# Patient Record
Sex: Female | Born: 1975 | Race: Black or African American | Hispanic: No | Marital: Single | Smoking: Never smoker
Health system: Southern US, Community
[De-identification: ages and names within clinical notes are randomized; demographics above are authoritative.]

## PROBLEM LIST (undated history)

## (undated) DIAGNOSIS — F32A Depression, unspecified: Secondary | ICD-10-CM

## (undated) DIAGNOSIS — F419 Anxiety disorder, unspecified: Secondary | ICD-10-CM

## (undated) DIAGNOSIS — F200 Paranoid schizophrenia: Secondary | ICD-10-CM

## (undated) DIAGNOSIS — F4310 Post-traumatic stress disorder, unspecified: Secondary | ICD-10-CM

## (undated) DIAGNOSIS — F329 Major depressive disorder, single episode, unspecified: Secondary | ICD-10-CM

## (undated) DIAGNOSIS — F319 Bipolar disorder, unspecified: Secondary | ICD-10-CM

## (undated) HISTORY — DX: Depression, unspecified: F32.A

## (undated) HISTORY — DX: Major depressive disorder, single episode, unspecified: F32.9

---

## 2016-03-15 ENCOUNTER — Encounter (HOSPITAL_COMMUNITY): Payer: Self-pay | Admitting: *Deleted

## 2016-03-15 ENCOUNTER — Emergency Department (HOSPITAL_COMMUNITY)
Admission: EM | Admit: 2016-03-15 | Discharge: 2016-03-15 | Disposition: A | Discharge status: Home / Self Care | Payer: Medicaid Other | Attending: Emergency Medicine | Admitting: Emergency Medicine

## 2016-03-15 DIAGNOSIS — G8929 Other chronic pain: Secondary | ICD-10-CM | POA: Diagnosis not present

## 2016-03-15 DIAGNOSIS — Z046 Encounter for general psychiatric examination, requested by authority: Secondary | ICD-10-CM | POA: Diagnosis not present

## 2016-03-15 DIAGNOSIS — Z760 Encounter for issue of repeat prescription: Secondary | ICD-10-CM | POA: Diagnosis not present

## 2016-03-15 DIAGNOSIS — F99 Mental disorder, not otherwise specified: Secondary | ICD-10-CM | POA: Diagnosis not present

## 2016-03-15 HISTORY — DX: Paranoid schizophrenia: F20.0

## 2016-03-15 HISTORY — DX: Anxiety disorder, unspecified: F41.9

## 2016-03-15 HISTORY — DX: Bipolar disorder, unspecified: F31.9

## 2016-03-15 HISTORY — DX: Post-traumatic stress disorder, unspecified: F43.10

## 2016-03-15 LAB — COMPREHENSIVE METABOLIC PANEL
ALBUMIN: 3.8 g/dL (ref 3.5–5.0)
ALK PHOS: 76 U/L (ref 38–126)
ALT: 19 U/L (ref 14–54)
ANION GAP: 5 (ref 5–15)
AST: 21 U/L (ref 15–41)
BILIRUBIN TOTAL: 0.4 mg/dL (ref 0.3–1.2)
BUN: 9 mg/dL (ref 6–20)
CALCIUM: 9.4 mg/dL (ref 8.9–10.3)
CO2: 25 mmol/L (ref 22–32)
CREATININE: 0.91 mg/dL (ref 0.44–1.00)
Chloride: 106 mmol/L (ref 101–111)
GFR calc Af Amer: >60 mL/min (ref >60)
GFR calc non Af Amer: >60 mL/min (ref >60)
GLUCOSE: 108 mg/dL — AB (ref 65–99)
Potassium: 3.7 mmol/L (ref 3.5–5.1)
Sodium: 136 mmol/L (ref 135–145)
TOTAL PROTEIN: 7.7 g/dL (ref 6.5–8.1)

## 2016-03-15 LAB — CBC WITH DIFFERENTIAL/PLATELET
BASOS PCT: 0 %
Basophils Absolute: 0 10*3/uL (ref 0.0–0.1)
EOS ABS: 0.1 10*3/uL (ref 0.0–0.7)
EOS PCT: 1 %
HCT: 37.5 % (ref 36.0–46.0)
Hemoglobin: 12.2 g/dL (ref 12.0–15.0)
LYMPHS ABS: 2.1 10*3/uL (ref 0.7–4.0)
Lymphocytes Relative: 28 %
MCH: 30 pg (ref 26.0–34.0)
MCHC: 32.5 g/dL (ref 30.0–36.0)
MCV: 92.4 fL (ref 78.0–100.0)
MONO ABS: 0.5 10*3/uL (ref 0.1–1.0)
MONOS PCT: 7 %
Neutro Abs: 4.7 10*3/uL (ref 1.7–7.7)
Neutrophils Relative %: 64 %
Platelets: 271 10*3/uL (ref 150–400)
RBC: 4.06 MIL/uL (ref 3.87–5.11)
RDW: 14.4 % (ref 11.5–15.5)
WBC: 7.5 10*3/uL (ref 4.0–10.5)

## 2016-03-15 LAB — ETHANOL: Alcohol, Ethyl (B): <5 mg/dL (ref <5)

## 2016-03-15 MED ORDER — ONDANSETRON 4 MG PO TBDP
4.0000 mg | ORAL_TABLET | Freq: Once | ORAL | Status: AC
  Administered 2016-03-15: 4 mg via ORAL
  Filled 2016-03-15: qty 1

## 2016-03-15 MED ORDER — OLANZAPINE 5 MG PO TABS
5.0000 mg | ORAL_TABLET | Freq: Every day | ORAL | Status: DC
  Administered 2016-03-15: 5 mg via ORAL
  Filled 2016-03-15: qty 1

## 2016-03-15 MED ORDER — PROPRANOLOL HCL 10 MG PO TABS
10.0000 mg | ORAL_TABLET | Freq: Two times a day (BID) | ORAL | Status: DC
  Filled 2016-03-15: qty 1

## 2016-03-15 MED ORDER — FLUOXETINE HCL 10 MG PO CAPS: 10.0000 mg | ORAL_CAPSULE | Freq: Every day | ORAL | 0 refills | Status: DC

## 2016-03-15 MED ORDER — OLANZAPINE 5 MG PO TABS: 5.0000 mg | ORAL_TABLET | Freq: Every day | ORAL | 0 refills | Status: DC

## 2016-03-15 MED ORDER — LAMOTRIGINE 25 MG PO TABS: 25.0000 mg | ORAL_TABLET | Freq: Every day | ORAL | 0 refills | Status: DC

## 2016-03-15 MED ORDER — FLUOXETINE HCL 10 MG PO CAPS
10.0000 mg | ORAL_CAPSULE | Freq: Every day | ORAL | Status: DC
  Administered 2016-03-15: 10 mg via ORAL
  Filled 2016-03-15: qty 1

## 2016-03-15 MED ORDER — PROPRANOLOL HCL 10 MG PO TABS
10.0000 mg | ORAL_TABLET | Freq: Two times a day (BID) | ORAL | 0 refills | Status: DC
Start: 2016-03-15 — End: 2017-07-27

## 2016-03-15 MED ORDER — LAMOTRIGINE 25 MG PO TABS
25.0000 mg | ORAL_TABLET | Freq: Every day | ORAL | Status: DC
  Administered 2016-03-15: 25 mg via ORAL
  Filled 2016-03-15: qty 1

## 2016-03-15 NOTE — ED Triage Notes (Signed)
Pt reports being off all her BH meds x 1 month. Pt has extreme anxiety, requesting help with meds and her anxiety but denies any SI or HI. Pt is anxious and tearful at triage.

## 2016-03-15 NOTE — ED Notes (Signed)
TTS at the bedside speaking with patient 

## 2016-03-15 NOTE — BH Assessment (Signed)
Tele Assessment Note   Justice RocherRasheeda S Dungee is a 40 y.o. female who [facility] under the advisement of social security administration. Pt reports moving to [place] from [place] @ a month ago, due to her caregiver not being able to house her anymore. Pt indicates that she has a sister here in [place], but she's not able to stay with her sister, b/c "she's in a program". Pt shares that she was just approved for her Medicaid today. Pt was not able to get any of her medications due to not having insurance prior to today. Pt denies SI, HI, AVH. Pt shares that back in [place] she had a rehabilitation worker to come see her 3xs/month, a therapist to see her 3xs/week, and a psychiatrist to see her 2xs/month. Pt appears to be slightly cognitively limited and could possibly benefit from an ACTT team to help her navigate thru the psychiatric system to restart her services.  Diagnosis: GAD  Past Medical History:  Past Medical History:  Diagnosis Date  . Anxiety   . Bipolar 1 disorder (HCC)   . Paranoid schizophrenia (HCC)   . PTSD (post-traumatic stress disorder)     History reviewed. No pertinent surgical history.  Family History: History reviewed. No pertinent family history.  Social History:  reports that she has never smoked. She does not have any smokeless tobacco history on file. She reports that she does not drink alcohol or use drugs.  Additional Social History:  Alcohol / Drug Use Pain Medications: none noted Prescriptions: none taken in @ a month Over the Counter: none noted History of alcohol / drug use?: No history of alcohol / drug abuse  CIWA: CIWA-Ar BP: 106/69 Pulse Rate: 93 COWS:    PATIENT STRENGTHS: (choose at least two) Capable of independent living Motivation for treatment/growth  Allergies: No Known Allergies  Home Medications:  (Not in a hospital admission)  OB/GYN Status:  Patient's last menstrual period was 03/08/2016.  General  Assessment Data [facility] TTS Assessment: In system Is this a Tele or Face-to-Face Assessment?: Tele Assessment Is this an Initial Assessment or a Re-assessment for this encounter?: Initial Assessment Marital status: Single Is patient pregnant?: No Pregnancy Status: No Living Arrangements: Other (Comment) (reports living w/ sister at times, sister's friend, & hotel) Can pt return to current living arrangement?: Yes Admission Status: Voluntary Is patient capable of signing voluntary admission?: Yes Referral Source: Self/Family/Friend Insurance type: Medicaid     Crisis Care Plan Living Arrangements: Other (Comment) (reports living w/ sister at times, sister's friend, & hotel) Name of Psychiatrist: none Name of Therapist: none  Education Status Is patient currently in school?: No  Risk to self with the past 6 months Suicidal Ideation: No Has patient been a risk to self within the past 6 months prior to admission? : No Suicidal Intent: No Has patient had any suicidal intent within the past 6 months prior to admission? : No Is patient at risk for suicide?: No Suicidal Plan?: No Has patient had any suicidal plan within the past 6 months prior to admission? : No Access to Means: No What has been your use of drugs/alcohol within the last 12 months?: no use Previous Attempts/Gestures: Yes How many times?: 3 Triggers for Past Attempts: Unknown Intentional Self Injurious Behavior: Cutting Comment - Self Injurious Behavior: hx of cutting, as a child Family Suicide History: Unknown Recent stressful life event(s): Other (Comment) (move from [place] w/ no support system) Persecutory voices/beliefs?: No Depression: Yes  Depression Symptoms: Tearfulness Substance abuse history and/or treatment for substance abuse?: No Suicide prevention information given to non-admitted patients: Not applicable  Risk to Others within the past 6 months Homicidal Ideation: No Does  patient have any lifetime risk of violence toward others beyond the six months prior to admission? : No Thoughts of Harm to Others: No Current Homicidal Intent: No Current Homicidal Plan: No Access to Homicidal Means: No History of harm to others?: No Assessment of Violence: None Noted Does patient have access to weapons?: No Criminal Charges Pending?: No Does patient have a court date: No Is patient on probation?: No  Psychosis Hallucinations: None noted Delusions: None noted  Mental Status Report Appearance/Hygiene: Unremarkable Eye Contact: Good Motor Activity: Unremarkable Speech: Logical/coherent Level of Consciousness: Alert Mood: Fearful, Helpless, Sad Affect: Appropriate to circumstance Anxiety Level: Moderate Thought Processes: Coherent, Relevant Judgement: Unimpaired Orientation: Person, Place, Time, Situation Obsessive Compulsive Thoughts/Behaviors: None  Cognitive Functioning Concentration: Normal Memory: Recent Intact, Remote Intact IQ: Average Insight: see judgement above Impulse Control: Good Appetite: Good Sleep: No Change Vegetative Symptoms: None  ADLScreening [facility]) Patient's cognitive ability adequate to safely complete daily activities?: Yes Patient able to express need for assistance with ADLs?: Yes Independently performs ADLs?: Yes (appropriate for developmental age)  Prior Inpatient Therapy Prior Inpatient Therapy: Yes Prior Therapy Dates: over 10 years ago Prior Therapy Facilty/Provider(s): facility in [place] Reason for Treatment: suicide attempt  Prior Outpatient Therapy Prior Outpatient Therapy: Yes Prior Therapy Dates: up until a month ago Prior Therapy Facilty/Provider(s): providers in [place] Does patient have an ACCT team?: No Does patient have Intensive In-House Services?  : No Does patient have [facility] services? : No Does patient have P4CC services?: No  ADL Screening (condition at time of  admission) Patient's cognitive ability adequate to safely complete daily activities?: Yes Is the patient deaf or have difficulty hearing?: No Does the patient have difficulty seeing, even when wearing glasses/contacts?: No Does the patient have difficulty concentrating, remembering, or making decisions?: Yes Patient able to express need for assistance with ADLs?: Yes Does the patient have difficulty dressing or bathing?: No Independently performs ADLs?: Yes (appropriate for developmental age) Does the patient have difficulty walking or climbing stairs?: No Weakness of Legs: None Weakness of Arms/Hands: None  Home Assistive Devices/Equipment Home Assistive Devices/Equipment: None  Therapy Consults (therapy consults require a physician order) PT Evaluation Needed: No OT Evalulation Needed: No SLP Evaluation Needed: No Abuse/Neglect Assessment (Assessment to be complete while patient is alone) Physical Abuse: Denies Verbal Abuse: Denies Sexual Abuse: Denies Exploitation of patient/patient's resources: Denies Values / Beliefs Cultural Requests During Hospitalization: None Spiritual Requests During Hospitalization: None Consults Spiritual Care Consult Needed: No Social Work Consult Needed: No Merchant navy officerAdvance Directives (For Healthcare) Does patient have an advance directive?: No Would patient like information on creating an advanced directive?: No - patient declined information    Additional Information 1:1 In Past 12 Months?: No CIRT Risk: No Elopement Risk: No Does patient have medical clearance?: Yes     Disposition:  Disposition Initial Assessment Completed for this Encounter: Yes (consulted with Fransisca KaufmannLaura Davis, NP) Disposition of Patient: Other dispositions (information given for OP services, including ACTT teams)  Laddie AquasSamantha M Adalida Garver 03/15/2016 6:48 PM

## 2016-03-15 NOTE — Discharge Instructions (Signed)
[facility] to [facility] soon as possible to get established with a psych team here. Meds for 1 month given by us.

## 2016-03-15 NOTE — ED Notes (Signed)
Pt finished with Telepsych

## 2016-03-15 NOTE — ED Provider Notes (Addendum)
[facility] Provider Note   CSN: [id number] Arrival date & time: 03/15/16  1403     History   Chief Complaint Chief Complaint  Patient presents with  . Medical Clearance  . Medication Refill    HPI Judy Melton is a 40 y.o. female.  HPI Pt comes in with cc of mental illness. Pt has hx of bipolar disorder and schizophrenia. [place] [facility] [place] and has been out of her meds. Today she was at social services office and had a mental breakdown and asked to come here. No SI, HI. Pt wants to get better, but feels that she needs help as she has decompensated and doesn't feel normal. Sister at bedside agrees with those sentiments. No drug use.   Past Medical History:  Diagnosis Date  . Anxiety   . Bipolar 1 disorder (HCC)   . Paranoid schizophrenia (HCC)   . PTSD (post-traumatic stress disorder)     There are no active problems to display for this patient.   History reviewed. No pertinent surgical history.  OB History    No data available       Home Medications    Prior to Admission medications   Medication Sig Start Date End Date Taking? Authorizing Provider  Menthol, Topical Analgesic, (BIOFREEZE EX) Apply 1 application topically daily.   Yes Historical Provider, MD  FLUoxetine (PROZAC) 10 MG capsule Take 1 capsule (10 mg total) by mouth daily. 03/15/16   Derwood KaplanAnkit Caoimhe Damron, MD  lamoTRIgine (LAMICTAL) 25 MG tablet Take 1 tablet (25 mg total) by mouth daily. 03/15/16   Derwood KaplanAnkit Ireoluwa Gorsline, MD  OLANZapine (ZYPREXA) 5 MG tablet Take 1 tablet (5 mg total) by mouth daily. 03/15/16   Derwood KaplanAnkit Littleton Haub, MD  propranolol (INDERAL) 10 MG tablet Take 1 tablet (10 mg total) by mouth 2 (two) times daily. 03/15/16   Derwood KaplanAnkit Liane Tribbey, MD    Family History History reviewed. No pertinent family history.  Social History Social History  Substance Use Topics  . Smoking status: Never Smoker  . Smokeless tobacco: Not on file  . Alcohol use No     Allergies   Review of patient's  allergies indicates no known allergies.   Review of Systems Review of Systems  ROS 10 Systems reviewed and are negative for acute change except as noted in the HPI.     Physical Exam Updated Vital Signs BP 106/69   Pulse 93   Temp 98.1 F (36.7 C) (Oral)   Resp 16   Ht 5\' 2"  (1.575 m)   Wt 175 lb (79.4 kg)   LMP 03/08/2016   SpO2 100%   BMI 32.01 kg/m   Physical Exam  Constitutional: She is oriented to person, place, and time. She appears well-developed.  HENT:  Head: Normocephalic and atraumatic.  Eyes: EOM are normal.  Neck: Normal range of motion. Neck supple.  Cardiovascular: Normal rate.   Pulmonary/Chest: Effort normal.  Abdominal: Bowel sounds are normal.  Neurological: She is alert and oriented to person, place, and time.  Skin: Skin is warm and dry.  Psychiatric:  Labile mood, crying  Nursing note and vitals reviewed.    ED Treatments / Results  Labs (all labs ordered are listed, but only abnormal results are displayed) Labs Reviewed  COMPREHENSIVE METABOLIC PANEL - Abnormal; Notable for the following:       Result Value   Glucose, Bld 108 (*)    All other components within normal limits  ETHANOL  CBC WITH DIFFERENTIAL/PLATELET  EKG  EKG Interpretation None       Radiology No results found.  Procedures Procedures (including critical care time)  Medications Ordered in ED Medications  FLUoxetine (PROZAC) capsule 10 mg (10 mg Oral Given 03/15/16 1741)  lamoTRIgine (LAMICTAL) tablet 25 mg (25 mg Oral Given 03/15/16 1726)  OLANZapine (ZYPREXA) tablet 5 mg (5 mg Oral Given 03/15/16 1726)  propranolol (INDERAL) tablet 10 mg (not administered)     Initial Impression / Assessment and Plan / ED Course  I have reviewed the triage vital signs and the nursing notes.  Pertinent labs & imaging results that were available during my care of the patient were reviewed by me and considered in my medical decision making (see chart for  details).  Clinical Course  Comment By Time  TTS clears the patient for outpatient f/u, and [facility] info has been provided. Pt and sister in agreement with the plan. Med prescription provided. Derwood KaplanAnkit Meldrick Buttery, MD 08/21 1847    Pt appears to be decompensated right now. Not psychotic and has no SI, HI. We will get TTS involved and see what their recs are.  Final Clinical Impressions(s) / ED Diagnoses   Final diagnoses:  Medication refill  Chronic mental illness    New Prescriptions Current Discharge Medication List       Derwood KaplanAnkit Aleric Froelich, MD 03/15/16 1708    Derwood KaplanAnkit Terrel Nesheiwat, MD 03/15/16 [id number]

## 2016-03-15 NOTE — ED Notes (Signed)
TTS in progress 

## 2016-08-02 ENCOUNTER — Ambulatory Visit: Payer: Medicaid Other | Admitting: Internal Medicine

## 2016-08-16 ENCOUNTER — Ambulatory Visit (INDEPENDENT_AMBULATORY_CARE_PROVIDER_SITE_OTHER): Payer: Medicaid Other

## 2016-08-16 ENCOUNTER — Ambulatory Visit (INDEPENDENT_AMBULATORY_CARE_PROVIDER_SITE_OTHER): Payer: Medicaid Other | Admitting: Orthopaedic Surgery

## 2016-08-16 ENCOUNTER — Encounter (INDEPENDENT_AMBULATORY_CARE_PROVIDER_SITE_OTHER): Payer: Self-pay

## 2016-08-16 DIAGNOSIS — M545 Low back pain: Secondary | ICD-10-CM | POA: Diagnosis not present

## 2016-08-16 DIAGNOSIS — G8929 Other chronic pain: Secondary | ICD-10-CM | POA: Diagnosis not present

## 2016-08-16 DIAGNOSIS — M549 Dorsalgia, unspecified: Secondary | ICD-10-CM

## 2016-08-16 MED ORDER — TIZANIDINE HCL 4 MG PO TABS: 4.0000 mg | ORAL_TABLET | Freq: Two times a day (BID) | ORAL | 0 refills | Status: DC | PRN

## 2016-08-16 MED ORDER — MELOXICAM 7.5 MG PO TABS: 7.5000 mg | ORAL_TABLET | Freq: Two times a day (BID) | ORAL | 1 refills | Status: DC | PRN

## 2016-08-16 MED ORDER — METHYLPREDNISOLONE 4 MG PO TABS: ORAL_TABLET | ORAL | 0 refills | Status: DC
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
## 2016-09-11 ENCOUNTER — Other Ambulatory Visit (INDEPENDENT_AMBULATORY_CARE_PROVIDER_SITE_OTHER): Payer: Self-pay | Admitting: Orthopaedic Surgery

## 2016-09-13 NOTE — Telephone Encounter (Signed)
Please advise 

## 2016-09-27 ENCOUNTER — Ambulatory Visit (INDEPENDENT_AMBULATORY_CARE_PROVIDER_SITE_OTHER): Payer: Medicaid Other | Admitting: Orthopaedic Surgery

## 2016-09-28 ENCOUNTER — Ambulatory Visit (INDEPENDENT_AMBULATORY_CARE_PROVIDER_SITE_OTHER): Payer: Medicaid Other | Admitting: Orthopaedic Surgery

## 2016-10-09 ENCOUNTER — Other Ambulatory Visit (INDEPENDENT_AMBULATORY_CARE_PROVIDER_SITE_OTHER): Payer: Self-pay | Admitting: Orthopaedic Surgery

## 2016-10-11 ENCOUNTER — Ambulatory Visit (INDEPENDENT_AMBULATORY_CARE_PROVIDER_SITE_OTHER): Payer: Medicaid Other | Admitting: Orthopaedic Surgery

## 2016-10-11 DIAGNOSIS — M546 Pain in thoracic spine: Secondary | ICD-10-CM | POA: Diagnosis not present

## 2016-10-11 NOTE — Telephone Encounter (Signed)
Please advise 

## 2016-10-11 NOTE — Progress Notes (Signed)
The patient is feeling much better after having a combination of a steroid taper, meloxicam, and a muscle relaxant. She is also been to therapy mainly with a personal trainer working on core strengthening exercises and other exercises. She said her pain now this point is minimal she only says is in the mid back. She denies any radicular symptoms at all. She denies any severe pain. She said the meloxicam his work for her the best.  On examination she has full flexion-extension of her cervical thoracic and lumbar spine without difficulty. She has 5 out of 5 strength of her bilateral upper and lower extremities. She has some midline tenderness of the thoracic spine but is minimal  This point she is on meloxicam as work for her she can continue this as needed. She'll continue her regular working with her Systems analystpersonal trainer. She can always try intermittent ice and heat. She'll follow-up as needed at this point since she is done so well.

## 2016-10-13 ENCOUNTER — Ambulatory Visit: Payer: Medicaid Other | Attending: Family Medicine | Admitting: Family Medicine

## 2016-10-13 ENCOUNTER — Encounter: Payer: Self-pay | Admitting: Family Medicine

## 2016-10-13 VITALS — BP 115/67 | HR 77 | Temp 98.3°F | Ht 64.0 in | Wt 194.0 lb

## 2016-10-13 DIAGNOSIS — F319 Bipolar disorder, unspecified: Secondary | ICD-10-CM | POA: Diagnosis not present

## 2016-10-13 DIAGNOSIS — K2100 Gastro-esophageal reflux disease with esophagitis, without bleeding: Secondary | ICD-10-CM

## 2016-10-13 DIAGNOSIS — Z0001 Encounter for general adult medical examination with abnormal findings: Secondary | ICD-10-CM | POA: Insufficient documentation

## 2016-10-13 DIAGNOSIS — M545 Low back pain, unspecified: Secondary | ICD-10-CM

## 2016-10-13 DIAGNOSIS — H527 Unspecified disorder of refraction: Secondary | ICD-10-CM

## 2016-10-13 DIAGNOSIS — Z853 Personal history of malignant neoplasm of breast: Secondary | ICD-10-CM | POA: Insufficient documentation

## 2016-10-13 DIAGNOSIS — M549 Dorsalgia, unspecified: Secondary | ICD-10-CM

## 2016-10-13 DIAGNOSIS — F200 Paranoid schizophrenia: Secondary | ICD-10-CM | POA: Diagnosis not present

## 2016-10-13 DIAGNOSIS — K219 Gastro-esophageal reflux disease without esophagitis: Secondary | ICD-10-CM | POA: Insufficient documentation

## 2016-10-13 DIAGNOSIS — Z9221 Personal history of antineoplastic chemotherapy: Secondary | ICD-10-CM | POA: Insufficient documentation

## 2016-10-13 DIAGNOSIS — G8929 Other chronic pain: Secondary | ICD-10-CM | POA: Diagnosis not present

## 2016-10-13 DIAGNOSIS — Z9889 Other specified postprocedural states: Secondary | ICD-10-CM | POA: Insufficient documentation

## 2016-10-13 DIAGNOSIS — Z79899 Other long term (current) drug therapy: Secondary | ICD-10-CM | POA: Diagnosis not present

## 2016-10-13 DIAGNOSIS — R112 Nausea with vomiting, unspecified: Secondary | ICD-10-CM | POA: Diagnosis not present

## 2016-10-13 MED ORDER — PROMETHAZINE HCL 25 MG PO TABS: 25.0000 mg | ORAL_TABLET | Freq: Two times a day (BID) | ORAL | 1 refills | Status: DC | PRN

## 2016-10-13 MED ORDER — MELOXICAM 7.5 MG PO TABS: 7.5000 mg | ORAL_TABLET | Freq: Every day | ORAL | 3 refills | Status: DC

## 2016-10-13 MED ORDER — OMEPRAZOLE 20 MG PO CPDR: 20.0000 mg | DELAYED_RELEASE_CAPSULE | Freq: Every day | ORAL | 3 refills | Status: DC

## 2016-10-13 MED ORDER — POLYETHYLENE GLYCOL 3350 17 GM/SCOOP PO POWD: 17.0000 g | Freq: Two times a day (BID) | ORAL | 1 refills | Status: AC | PRN

## 2016-10-13 NOTE — Progress Notes (Signed)
Subjective:  Patient ID: Judy Melton, female    DOB: 01/18/1976  Age: 41 y.o. MRN: [id number]  CC: hx of breast cancer; eye MD referral; Arthritis; Manic Behavior; Schizophrenia; Anxiety; Constipation; and Gastroesophageal Reflux   HPI Judy Melton is a 41 year old female with a history of schizophrenia, bipolar disorder, left breast cancer (diagnosed in 2010 status post lumpectomy and chemotherapy), chronic low back pain, GERD who presents today to establish care after relocating from [place] in 01/2016.  Her schizophrenia and bipolar disorder are managed by mental health where she receives her medications and undergoes therapy.  She takes meloxicam and tizanidine as needed for chronic low back pain which does not radiate to her legs; informs me she was told she had arthritis in her back. Denies numbness in extremities or and has no loss of sphincteric function.  Complains nausea and vomiting with anything she ingests, associated bloating and occasional epigastric pain. Denies symptoms at this time. States symptoms are embarrassing and unpredictable; sometimes she has to run out of a room to go throw up when symptoms occur and at other times she is able to hold it in. Requests referral to an ophthalmologist due to vision problems.  Past Medical History:  Diagnosis Date  . Anxiety   . Bipolar 1 disorder (HCC)   . Paranoid schizophrenia (HCC)   . PTSD (post-traumatic stress disorder)     History reviewed. No pertinent surgical history.  No Known Allergies   Outpatient Medications Prior to Visit  Medication Sig Dispense Refill  . FLUoxetine (PROZAC) 10 MG capsule Take 1 capsule (10 mg total) by mouth daily. 30 capsule 0  . lamoTRIgine (LAMICTAL) 25 MG tablet Take 1 tablet (25 mg total) by mouth daily. 30 tablet 0  . Menthol, Topical Analgesic, (BIOFREEZE EX) Apply 1 application topically daily.    Marland Kitchen. OLANZapine (ZYPREXA) 5 MG tablet Take 1 tablet (5 mg total) by mouth  daily. 30 tablet 0  . propranolol (INDERAL) 10 MG tablet Take 1 tablet (10 mg total) by mouth 2 (two) times daily. 60 tablet 0  . tiZANidine (ZANAFLEX) 4 MG tablet TAKE 1 TABLET (4 MG TOTAL) BY MOUTH 2 (TWO) TIMES DAILY AS NEEDED FOR MUSCLE SPASMS. 60 tablet 0  . meloxicam (MOBIC) 7.5 MG tablet TAKE 1 TABLET (7.5 MG TOTAL) BY MOUTH 2 (TWO) TIMES DAILY BETWEEN MEALS AS NEEDED FOR PAIN. 60 tablet 1  . methylPREDNISolone (MEDROL) 4 MG tablet Medrol dose pack. Take as instructed 21 tablet 0   No facility-administered medications prior to visit.     ROS Review of Systems  Constitutional: Negative for activity change, appetite change and fatigue.  HENT: Negative for congestion, sinus pressure and sore throat.   Eyes: Positive for visual disturbance.  Respiratory: Negative for cough, chest tightness, shortness of breath and wheezing.   Cardiovascular: Negative for chest pain and palpitations.  Gastrointestinal: Positive for constipation, nausea and vomiting. Negative for abdominal distention and abdominal pain.  Endocrine: Negative for polydipsia.  Genitourinary: Negative for dysuria and frequency.  Musculoskeletal: Positive for back pain. Negative for arthralgias.  Skin: Negative for rash.  Neurological: Negative for tremors, light-headedness and numbness.  Hematological: Does not bruise/bleed easily.  Psychiatric/Behavioral: Negative for agitation and behavioral problems.    Objective:  BP 115/67 (BP Location: Right Arm, Patient Position: Sitting, Cuff Size: Small)   Pulse 77   Temp 98.3 F (36.8 C) (Oral)   Ht 5\' 4"  (1.626 m)   Wt 194 lb (88 kg)  SpO2 97%   BMI 33.30 kg/m   BP/Weight 10/13/2016 03/15/2016  Systolic BP 115 123  Diastolic BP 67 75  Wt. (Lbs) 194 175  BMI 33.3 32.01      Physical Exam  Constitutional: She is oriented to person, place, and time. She appears well-developed and well-nourished.  Cardiovascular: Normal rate, normal heart sounds and intact distal  pulses.   No murmur heard. Pulmonary/Chest: Effort normal and breath sounds normal. She has no wheezes. She has no rales. She exhibits no tenderness.  Abdominal: Soft. Bowel sounds are normal. She exhibits no distension and no mass. There is no tenderness.  Musculoskeletal: Normal range of motion.  Neurological: She is alert and oriented to person, place, and time.  Skin: Skin is warm and dry.  Psychiatric: She has a normal mood and affect.     Assessment & Plan:   1. Gastroesophageal reflux disease with esophagitis - H. pylori breath test - omeprazole (PRILOSEC) 20 MG capsule; Take 1 capsule (20 mg total) by mouth daily.  Dispense: 30 capsule; Refill: 3 - promethazine (PHENERGAN) 25 MG tablet; Take 1 tablet (25 mg total) by mouth every 12 (twelve) hours as needed for nausea or vomiting.  Dispense: 60 tablet; Refill: 1  2. History of breast cancer At post lumpectomy and chemotherapy Will need to obtain previous records Will need referral to oncology at the next visit  3. Chronic midline low back pain without sciatica Controlled on meloxicam and tizanidine which she uses as needed Apply heat - meloxicam (MOBIC) 7.5 MG tablet; Take 1 tablet (7.5 mg total) by mouth daily.  Dispense: 30 tablet; Refill: 3  4. Refractive errors - Ambulatory referral to Ophthalmology  5. Paranoid schizophrenia (HCC) Currently on Zyprexa, Invega  6. Bipolar 1 disorder (HCC) Continue Prozac Continue appointments with psychiatry and therapist   Meds ordered this encounter  Medications  . meloxicam (MOBIC) 7.5 MG tablet    Sig: Take 1 tablet (7.5 mg total) by mouth daily.    Dispense:  30 tablet    Refill:  3  . omeprazole (PRILOSEC) 20 MG capsule    Sig: Take 1 capsule (20 mg total) by mouth daily.    Dispense:  30 capsule    Refill:  3  . promethazine (PHENERGAN) 25 MG tablet    Sig: Take 1 tablet (25 mg total) by mouth every 12 (twelve) hours as needed for nausea or vomiting.    Dispense:   60 tablet    Refill:  1  . polyethylene glycol powder (GLYCOLAX/MIRALAX) powder    Sig: Take 17 g by mouth 2 (two) times daily as needed.    Dispense:  3350 g    Refill:  1    Follow-up: Return in about 1 month (around 11/13/2016) for Follow-up: GERD.   Jaclyn Shaggy MD

## 2016-10-13 NOTE — Patient Instructions (Signed)
Food Choices for Gastroesophageal Reflux Disease, Adult When you have gastroesophageal reflux disease (GERD), the foods you eat and your eating habits are very important. Choosing the right foods can help ease your discomfort. What guidelines do I need to follow?  Choose fruits, vegetables, whole grains, and low-fat dairy products.  Choose low-fat meat, fish, and poultry.  Limit fats such as oils, salad dressings, butter, nuts, and avocado.  Keep a food diary. This helps you identify foods that cause symptoms.  Avoid foods that cause symptoms. These may be different for everyone.  Eat small meals often instead of 3 large meals a day.  Eat your meals slowly, in a place where you are relaxed.  Limit fried foods.  Cook foods using methods other than frying.  Avoid drinking alcohol.  Avoid drinking large amounts of liquids with your meals.  Avoid bending over or lying down until 2-3 hours after eating. What foods are not recommended? These are some foods and drinks that may make your symptoms worse: Vegetables  Tomatoes. Tomato juice. Tomato and spaghetti sauce. Chili peppers. Onion and garlic. Horseradish. Fruits  Oranges, grapefruit, and lemon (fruit and juice). Meats  High-fat meats, fish, and poultry. This includes hot dogs, ribs, ham, sausage, salami, and bacon. Dairy  Whole milk and chocolate milk. Sour cream. Cream. Butter. Ice cream. Cream cheese. Drinks  Coffee and tea. Bubbly (carbonated) drinks or energy drinks. Condiments  Hot sauce. Barbecue sauce. Sweets/Desserts  Chocolate and cocoa. Donuts. Peppermint and spearmint. Fats and Oils  High-fat foods. This includes French fries and potato chips. Other  Vinegar. Strong spices. This includes black pepper, white pepper, red pepper, cayenne, curry powder, cloves, ginger, and chili powder. The items listed above may not be a complete list of foods and drinks to avoid. Contact your dietitian for more information.    This information is not intended to replace advice given to you by your health care provider. Make sure you discuss any questions you have with your health care provider. Document Released: 01/11/2012 Document Revised: 12/18/2015 Document Reviewed: 05/16/2013 Elsevier Interactive Patient Education  2017 Elsevier Inc.  

## 2016-10-14 LAB — H. PYLORI BREATH TEST: H. pylori Breath Test: NOT DETECTED

## 2016-10-18 ENCOUNTER — Other Ambulatory Visit: Payer: Medicaid Other

## 2016-10-26 ENCOUNTER — Telehealth: Payer: Self-pay

## 2016-10-26 NOTE — Telephone Encounter (Signed)
Writer called patient with lab results. Patient stated understanding. 

## 2016-10-26 NOTE — Telephone Encounter (Signed)
-----   Message from Jaclyn Shaggy, MD sent at 10/14/2016  2:06 PM EDT ----- Please inform the patient that labs are normal. Thank you.

## 2016-10-27 ENCOUNTER — Other Ambulatory Visit: Payer: Medicaid Other

## 2016-11-06 ENCOUNTER — Other Ambulatory Visit (INDEPENDENT_AMBULATORY_CARE_PROVIDER_SITE_OTHER): Payer: Self-pay | Admitting: Orthopaedic Surgery

## 2016-11-08 NOTE — Telephone Encounter (Signed)
Please advise 

## 2016-11-12 ENCOUNTER — Ambulatory Visit: Payer: Medicaid Other | Admitting: Family Medicine

## 2016-12-04 ENCOUNTER — Other Ambulatory Visit (INDEPENDENT_AMBULATORY_CARE_PROVIDER_SITE_OTHER): Payer: Self-pay | Admitting: Orthopaedic Surgery

## 2016-12-06 NOTE — Telephone Encounter (Signed)
Please advise 

## 2017-01-24 ENCOUNTER — Other Ambulatory Visit (INDEPENDENT_AMBULATORY_CARE_PROVIDER_SITE_OTHER): Payer: Self-pay | Admitting: Orthopaedic Surgery

## 2017-01-24 NOTE — Telephone Encounter (Signed)
Please advise 

## 2017-03-07 ENCOUNTER — Other Ambulatory Visit: Payer: Self-pay | Admitting: Family Medicine

## 2017-03-07 ENCOUNTER — Other Ambulatory Visit (INDEPENDENT_AMBULATORY_CARE_PROVIDER_SITE_OTHER): Payer: Self-pay | Admitting: Orthopaedic Surgery

## 2017-03-07 DIAGNOSIS — K2100 Gastro-esophageal reflux disease with esophagitis, without bleeding: Secondary | ICD-10-CM

## 2017-03-07 NOTE — Telephone Encounter (Signed)
Please advise 

## 2017-04-14 ENCOUNTER — Emergency Department (HOSPITAL_COMMUNITY)
Admission: EM | Admit: 2017-04-14 | Discharge: 2017-04-14 | Disposition: A | Discharge status: Home / Self Care | Payer: Medicaid Other | Attending: Emergency Medicine | Admitting: Emergency Medicine

## 2017-04-14 ENCOUNTER — Encounter (HOSPITAL_COMMUNITY): Payer: Self-pay | Admitting: Emergency Medicine

## 2017-04-14 DIAGNOSIS — N912 Amenorrhea, unspecified: Secondary | ICD-10-CM | POA: Diagnosis not present

## 2017-04-14 DIAGNOSIS — M545 Low back pain, unspecified: Secondary | ICD-10-CM

## 2017-04-14 DIAGNOSIS — Z79899 Other long term (current) drug therapy: Secondary | ICD-10-CM | POA: Insufficient documentation

## 2017-04-14 DIAGNOSIS — Z853 Personal history of malignant neoplasm of breast: Secondary | ICD-10-CM | POA: Insufficient documentation

## 2017-04-14 LAB — URINALYSIS, ROUTINE W REFLEX MICROSCOPIC
Bilirubin Urine: NEGATIVE
GLUCOSE, UA: NEGATIVE mg/dL
Hgb urine dipstick: NEGATIVE
Ketones, ur: NEGATIVE mg/dL
Leukocytes, UA: NEGATIVE
Nitrite: NEGATIVE
PH: 6 (ref 5.0–8.0)
Protein, ur: NEGATIVE mg/dL
SPECIFIC GRAVITY, URINE: 1.009 (ref 1.005–1.030)

## 2017-04-14 LAB — POC URINE PREG, ED: Preg Test, Ur: NEGATIVE

## 2017-04-14 NOTE — ED Triage Notes (Addendum)
Pt complaint of lower back pain, urinary frequency, and tender breasts; 12 days late for menstrual cycle. Pt seen at urgent care for pregnancy test; negative result. Pt denies vaginal bleeding, pain, or abnormal discharge/odor.

## 2017-04-14 NOTE — ED Notes (Signed)
Bed: WTR8 Expected date:  Expected time:  Means of arrival:  Comments: 

## 2017-04-14 NOTE — ED Provider Notes (Signed)
[facility]-EMERGENCY DEPT Provider Note   CSN: [id number] Arrival date & time: 04/14/17  1026     History   Chief Complaint Chief Complaint  Patient presents with  . Back Pain    HPI Judy Melton is a 41 y.o. female.  The history is provided by the patient. No language interpreter was used.  Back Pain   This is a new problem. The problem occurs constantly. The problem has not changed since onset.The pain is associated with no known injury. The pain is present in the lumbar spine. The pain is moderate. The pain is worse during the day. She has tried nothing for the symptoms. The treatment provided no relief.  Pt is concerned because she has not had a period since August 11.  Pt reports she had a negative home pregnancy test.    Past Medical History:  Diagnosis Date  . Anxiety   . Bipolar 1 disorder (HCC)   . Paranoid schizophrenia (HCC)   . PTSD (post-traumatic stress disorder)     Patient Active Problem List   Diagnosis Date Noted  . GERD (gastroesophageal reflux disease) 10/13/2016  . History of breast cancer 10/13/2016  . Chronic back pain 10/13/2016  . Paranoid schizophrenia (HCC) 10/13/2016  . Bipolar 1 disorder (HCC) 10/13/2016  . Pain in thoracic spine 10/11/2016    History reviewed. No pertinent surgical history.  OB History    No data available       Home Medications    Prior to Admission medications   Medication Sig Start Date End Date Taking? Authorizing Provider  FLUoxetine (PROZAC) 10 MG capsule Take 1 capsule (10 mg total) by mouth daily. 03/15/16   Derwood Kaplan, MD  lamoTRIgine (LAMICTAL) 25 MG tablet Take 1 tablet (25 mg total) by mouth daily. 03/15/16   Derwood Kaplan, MD  meloxicam (MOBIC) 7.5 MG tablet Take 1 tablet (7.5 mg total) by mouth daily. 10/13/16   Jaclyn Shaggy, MD  meloxicam (MOBIC) 7.5 MG tablet TAKE 1 TABLET (7.5 MG TOTAL) BY MOUTH 2 (TWO) TIMES DAILY BETWEEN MEALS AS NEEDED FOR PAIN. 03/07/17   Kathryne Hitch, MD    Menthol, Topical Analgesic, (BIOFREEZE EX) Apply 1 application topically daily.    [provider]  OLANZapine (ZYPREXA) 5 MG tablet Take 1 tablet (5 mg total) by mouth daily. 03/15/16   Derwood Kaplan, MD  omeprazole (PRILOSEC) 20 MG capsule TAKE 1 CAPSULE (20 MG TOTAL) BY MOUTH DAILY. 03/07/17   Jaclyn Shaggy, MD  paliperidone (INVEGA) 6 MG 24 hr tablet Take 6 mg by mouth daily.    [provider]  polyethylene glycol powder (GLYCOLAX/MIRALAX) powder Take 17 g by mouth 2 (two) times daily as needed. 10/13/16   Jaclyn Shaggy, MD  promethazine (PHENERGAN) 25 MG tablet Take 1 tablet (25 mg total) by mouth every 12 (twelve) hours as needed for nausea or vomiting. 10/13/16   Jaclyn Shaggy, MD  propranolol (INDERAL) 10 MG tablet Take 1 tablet (10 mg total) by mouth 2 (two) times daily. 03/15/16   Derwood Kaplan, MD  tiZANidine (ZANAFLEX) 4 MG tablet TAKE 1 TABLET (4 MG TOTAL) BY MOUTH 2 (TWO) TIMES DAILY AS NEEDED FOR MUSCLE SPASMS. 03/07/17   Kathryne Hitch, MD    Family History No family history on file.  Social History Social History  Substance Use Topics  . Smoking status: Never Smoker  . Smokeless tobacco: Never Used  . Alcohol use No     Allergies   Patient has no known  allergies.   Review of Systems Review of Systems  Musculoskeletal: Positive for back pain.  All other systems reviewed and are negative.    Physical Exam Updated Vital Signs BP 126/76 (BP Location: Left Arm)   Pulse 86   Temp 98.3 F (36.8 C) (Oral)   Resp 17   Ht  (1.575 m)   Wt 89.4 kg (197 lb)   LMP 03/05/2017   SpO2 99%   BMI 36.03 kg/m   Physical Exam  Constitutional: She appears well-developed.  HENT:  Head: Normocephalic.  Right Ear: External ear normal.  Left Ear: External ear normal.  Eyes: Pupils are equal, round, and reactive to light.  Neck: Normal range of motion.  Cardiovascular: Normal rate.   Pulmonary/Chest: Effort normal.  Abdominal: Soft.   Musculoskeletal: Normal range of motion.  Neurological: She is alert.  Skin: Skin is warm. Capillary refill takes less than 2 seconds.  Nursing note and vitals reviewed.    ED Treatments / Results  Labs (all labs ordered are listed, but only abnormal results are displayed) Labs Reviewed  URINALYSIS, ROUTINE W REFLEX MICROSCOPIC  POC URINE PREG, ED    EKG  EKG Interpretation None       Radiology No results found.  Procedures Procedures (including critical care time)  Medications Ordered in ED Medications - No data to display   Initial Impression / Assessment and Plan / ED Course  I have reviewed the triage vital signs and the nursing notes.  Pertinent labs & imaging results that were available during my care of the patient were reviewed by me and considered in my medical decision making (see chart for details).     Pt advised to schedule appointment at [facility] gyn clinic for evaluation.   Tylenol for back pain  Final Clinical Impressions(s) / ED Diagnoses   Final diagnoses:  Low back pain without sciatica, unspecified back pain laterality, unspecified chronicity  Amenorrhea    New Prescriptions Discharge Medication List as of 04/14/2017 12:05 PM    An After Visit Summary was printed and given to the patient.   Elson Areas, Cordelia Poche 04/14/17 1240    Linwood Dibbles, MD 04/15/17 [phone]

## 2017-04-18 ENCOUNTER — Encounter: Payer: Medicaid Other | Admitting: Obstetrics & Gynecology

## 2017-04-27 ENCOUNTER — Ambulatory Visit (INDEPENDENT_AMBULATORY_CARE_PROVIDER_SITE_OTHER): Payer: Medicaid Other | Admitting: Obstetrics & Gynecology

## 2017-04-27 ENCOUNTER — Other Ambulatory Visit (HOSPITAL_COMMUNITY)
Admission: RE | Admit: 2017-04-27 | Discharge: 2017-04-27 | Disposition: A | Discharge status: Home / Self Care | Payer: Medicaid Other | Source: Ambulatory Visit | Attending: Obstetrics & Gynecology | Admitting: Obstetrics & Gynecology

## 2017-04-27 ENCOUNTER — Ambulatory Visit: Payer: Self-pay | Admitting: Clinical

## 2017-04-27 ENCOUNTER — Encounter: Payer: Self-pay | Admitting: Obstetrics & Gynecology

## 2017-04-27 VITALS — BP 102/54 | HR 84 | Wt 196.6 lb

## 2017-04-27 DIAGNOSIS — Z124 Encounter for screening for malignant neoplasm of cervix: Secondary | ICD-10-CM | POA: Diagnosis not present

## 2017-04-27 DIAGNOSIS — B9689 Other specified bacterial agents as the cause of diseases classified elsewhere: Secondary | ICD-10-CM | POA: Insufficient documentation

## 2017-04-27 DIAGNOSIS — F39 Unspecified mood [affective] disorder: Secondary | ICD-10-CM

## 2017-04-27 DIAGNOSIS — N926 Irregular menstruation, unspecified: Secondary | ICD-10-CM | POA: Diagnosis not present

## 2017-04-27 DIAGNOSIS — Z0000 Encounter for general adult medical examination without abnormal findings: Secondary | ICD-10-CM

## 2017-04-27 DIAGNOSIS — Z113 Encounter for screening for infections with a predominantly sexual mode of transmission: Secondary | ICD-10-CM
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
## 2017-04-27 NOTE — BH Specialist Note (Signed)
Integrated Behavioral Health Initial Visit  MRN: [id number] Name: Judy Melton  Number of Integrated Behavioral Health Clinician visits:: 1/6 Session Start time: 3:45  Session End time: 4:00 Total time: 15 minutes  Type of Service: Integrated Behavioral Health- Individual/Family Interpretor:No. Interpretor Name and Language: n/a   Warm Hand Off Completed.       SUBJECTIVE: Judy Melton is a 41 y.o. female accompanied by n/a Patient was referred by Dr Marice Potter for pt's self-report of depression. Patient reports the following symptoms/concerns: Pt states she is feeling "a little depressed" lately, since finding out she won't be able to have more children, and is concerned that she has been forgetting to eat, leading to headaches. Pt says she is going to a therapist weekly, and sees a psychiatrist regularly.  Duration of problem: Ongoing; Severity of problem: severe  OBJECTIVE: Mood: Depressed and Affect: Appropriate Risk of harm to self or others: No plan to harm self or others  LIFE CONTEXT: Family and Social: Lives by herself; has a 24yo son. Pt's extended family is close and supportive. School/Work: - Self-Care: Pt attends therapy weekly for self care; likes using the Calm app sometimes for self-care Life Changes: Recently forgetting to eat, resulting in headaches  GOALS ADDRESSED: Patient will: 1. Reduce symptoms of: depression and headache 2. Increase knowledge and/or ability of: self-management skills  3. Demonstrate ability to: Increase healthy adjustment to current life circumstances  INTERVENTIONS: Interventions utilized: Supportive Counseling and Psychoeducation and/or Health Education  Standardized Assessments completed: GAD-7 and PHQ 9  ASSESSMENT: Patient currently experiencing Mood disorder.   Patient may benefit from psychoeducation and brief therapeutic intervention regarding coping with symptoms of depression related to mood  disorder.  PLAN: 1. Follow up with behavioral health clinician on : As needed 2. Behavioral recommendations:  -Set daily timer on phone, as a reminder to eat regular meals, to help prevent future headaches -Continue attending weekly therapy and psychiatry appointments at [facility] -Read educational material regarding coping with symptoms of depression -Consider additional apps for self-care, as discussed in office visit 3. Referral(s): Integrated [facility] (In Clinic) 4. "From scale of 1-10, how likely are you to follow plan?": 7    Rae Lips, LCSWA  Depression screen [facility] 2/9 10/13/2016  Decreased Interest 3  Down, Depressed, Hopeless 3  PHQ - 2 Score 6  Altered sleeping 3  Tired, decreased energy 3  Change in appetite 3  Feeling bad or failure about yourself  3  Trouble concentrating 2  Moving slowly or fidgety/restless 3  Suicidal thoughts 0  PHQ-9 Score 23   GAD 7 : Generalized Anxiety Score 10/13/2016  Nervous, Anxious, on Edge 3  Control/stop worrying 3  Worry too much - different things 3  Trouble relaxing 3  Restless 3  Easily annoyed or irritable 3  Afraid - awful might happen 3  Total GAD 7 Score 21

## 2017-04-28 LAB — CBC
Hematocrit: 35.6 % (ref 34.0–46.6)
Hemoglobin: 11.5 g/dL (ref 11.1–15.9)
MCH: 29 pg (ref 26.6–33.0)
MCHC: 32.3 g/dL (ref 31.5–35.7)
MCV: 90 fL (ref 79–97)
PLATELETS: 341 10*3/uL (ref 150–379)
RBC: 3.96 x10E6/uL (ref 3.77–5.28)
RDW: 15.2 % (ref 12.3–15.4)
WBC: 5.3 10*3/uL (ref 3.4–10.8)

## 2017-04-28 LAB — RPR: RPR Ser Ql: NONREACTIVE

## 2017-04-28 LAB — HIV ANTIBODY (ROUTINE TESTING W REFLEX): HIV Screen 4th Generation wRfx: NONREACTIVE

## 2017-04-28 LAB — HEPATITIS C ANTIBODY: Hep C Virus Ab: <0.1 s/co ratio (ref 0.0–0.9)

## 2017-04-28 LAB — HEPATITIS B SURFACE ANTIGEN: Hepatitis B Surface Ag: NEGATIVE

## 2017-04-28 LAB — TSH: TSH: 1.91 u[IU]/mL (ref 0.450–4.500)

## 2017-04-29 LAB — CYTOLOGY - PAP
Chlamydia: NEGATIVE
DIAGNOSIS: NEGATIVE
HPV (WINDOPATH): NOT DETECTED
NEISSERIA GONORRHEA: NEGATIVE

## 2017-05-18 ENCOUNTER — Emergency Department (HOSPITAL_COMMUNITY)
Admission: EM | Admit: 2017-05-18 | Discharge: 2017-05-18 | Disposition: A | Discharge status: Home / Self Care | Payer: Medicaid Other | Attending: Emergency Medicine | Admitting: Emergency Medicine

## 2017-05-18 ENCOUNTER — Encounter (HOSPITAL_COMMUNITY): Payer: Self-pay | Admitting: Emergency Medicine

## 2017-05-18 DIAGNOSIS — Z853 Personal history of malignant neoplasm of breast: Secondary | ICD-10-CM | POA: Insufficient documentation

## 2017-05-18 DIAGNOSIS — F419 Anxiety disorder, unspecified: Secondary | ICD-10-CM | POA: Diagnosis not present

## 2017-05-18 DIAGNOSIS — Z9114 Patient's other noncompliance with medication regimen: Secondary | ICD-10-CM | POA: Insufficient documentation

## 2017-05-18 DIAGNOSIS — R51 Headache: Secondary | ICD-10-CM | POA: Diagnosis not present

## 2017-05-18 DIAGNOSIS — Z008 Encounter for other general examination: Secondary | ICD-10-CM | POA: Insufficient documentation

## 2017-05-18 DIAGNOSIS — R410 Disorientation, unspecified: Secondary | ICD-10-CM | POA: Diagnosis present

## 2017-05-18 MED ORDER — HYDROXYZINE HCL 25 MG PO TABS
25.0000 mg | ORAL_TABLET | Freq: Four times a day (QID) | ORAL | Status: DC | PRN
Start: 2017-05-18 — End: 2017-05-18
  Administered 2017-05-18: 25 mg via ORAL
  Filled 2017-05-18: qty 1

## 2017-05-18 MED ORDER — HYDROXYZINE HCL 25 MG PO TABS: 25.0000 mg | ORAL_TABLET | Freq: Four times a day (QID) | ORAL | 0 refills | Status: AC | PRN

## 2017-05-18 NOTE — ED Provider Notes (Signed)
[facility]-EMERGENCY DEPT Provider Note   CSN: [id number] Arrival date & time: 05/18/17  1108     History   Chief Complaint Chief Complaint  Patient presents with  . Anxiety    HPI Judy Melton is a 41 y.o. female.  HPI Patient is a 41 year old female with a history of bipolar disorder, PTSD, schizophrenia, anxiety who presents to the emergency department at the request of her therapist for increasing emotional instability.  Patient reports that she feels confused.  She has been off her medications.  She has been sad and at times angry and upset.  Her moods have been rapidly fluctuating.  She is having headaches.  Patient reports increasing stressors at work.   Past Medical History:  Diagnosis Date  . Anxiety   . Bipolar 1 disorder (HCC)   . Paranoid schizophrenia (HCC)   . PTSD (post-traumatic stress disorder)     Patient Active Problem List   Diagnosis Date Noted  . GERD (gastroesophageal reflux disease) 10/13/2016  . History of breast cancer 10/13/2016  . Chronic back pain 10/13/2016  . Paranoid schizophrenia (HCC) 10/13/2016  . Bipolar 1 disorder (HCC) 10/13/2016  . Pain in thoracic spine 10/11/2016    History reviewed. No pertinent surgical history.  OB History    No data available       Home Medications    Prior to Admission medications   Medication Sig Start Date End Date Taking? Authorizing Provider  acetaminophen (TYLENOL) 500 MG tablet Take 1,000 mg by mouth every 6 (six) hours as needed for mild pain, moderate pain, fever or headache.   Yes [provider]  meloxicam (MOBIC) 7.5 MG tablet TAKE 1 TABLET (7.5 MG TOTAL) BY MOUTH 2 (TWO) TIMES DAILY BETWEEN MEALS AS NEEDED FOR PAIN. Patient taking differently: Take 7.5 mg by mouth 2 (two) times daily.  03/07/17  Yes Kathryne HitchBlackman, Christopher Y, MD  polyethylene glycol powder (GLYCOLAX/MIRALAX) powder Take 17 g by mouth 2 (two) times daily as needed. Patient taking  differently: Take 17 g by mouth 2 (two) times daily as needed for mild constipation.  10/13/16  Yes Amao, Odette HornsEnobong, MD  tiZANidine (ZANAFLEX) 4 MG tablet TAKE 1 TABLET (4 MG TOTAL) BY MOUTH 2 (TWO) TIMES DAILY AS NEEDED FOR MUSCLE SPASMS. 03/07/17  Yes Kathryne HitchBlackman, Christopher Y, MD  FLUoxetine (PROZAC) 10 MG capsule Take 1 capsule (10 mg total) by mouth daily. Patient not taking: Reported on 05/18/2017 03/15/16   Derwood KaplanNanavati, Ankit, MD  lamoTRIgine (LAMICTAL) 25 MG tablet Take 1 tablet (25 mg total) by mouth daily. Patient not taking: Reported on 05/18/2017 03/15/16   Derwood KaplanNanavati, Ankit, MD  OLANZapine (ZYPREXA) 5 MG tablet Take 1 tablet (5 mg total) by mouth daily. Patient not taking: Reported on 05/18/2017 03/15/16   Derwood KaplanNanavati, Ankit, MD  omeprazole (PRILOSEC) 20 MG capsule TAKE 1 CAPSULE (20 MG TOTAL) BY MOUTH DAILY. Patient not taking: Reported on 05/18/2017 03/07/17   Jaclyn ShaggyAmao, Enobong, MD  propranolol (INDERAL) 10 MG tablet Take 1 tablet (10 mg total) by mouth 2 (two) times daily. Patient not taking: Reported on 05/18/2017 03/15/16   Derwood KaplanNanavati, Ankit, MD    Family History No family history on file.  Social History Social History  Substance Use Topics  . Smoking status: Never Smoker  . Smokeless tobacco: Never Used  . Alcohol use No     Allergies   Patient has no known allergies.   Review of Systems Review of Systems  All other systems reviewed and are  negative.    Physical Exam Updated Vital Signs BP 132/88 (BP Location: Right Arm)   Pulse 81   Temp 98.4 F (36.9 C) (Oral)   Resp 18   SpO2 97%   Physical Exam  Constitutional: She is oriented to person, place, and time. She appears well-developed and well-nourished.  HENT:  Head: Normocephalic.  Eyes: EOM are normal.  Neck: Normal range of motion.  Pulmonary/Chest: Effort normal.  Abdominal: She exhibits no distension.  Musculoskeletal: Normal range of motion.  Neurological: She is alert and oriented to person, place, and time.    Psychiatric:  Tearful.  Flat affect.  Speaks slowly.  Nursing note and vitals reviewed.    ED Treatments / Results  Labs (all labs ordered are listed, but only abnormal results are displayed) Labs Reviewed  CBC  BASIC METABOLIC PANEL  ETHANOL  RAPID URINE DRUG SCREEN, HOSP PERFORMED  PREGNANCY, URINE  I-STAT BETA HCG BLOOD, ED ([facility], [facility], [facility] ONLY)    EKG  EKG Interpretation None       Radiology No results found.  Procedures Procedures (including critical care time)  Medications Ordered in ED Medications - No data to display   Initial Impression / Assessment and Plan / ED Course  I have reviewed the triage vital signs and the nursing notes.  Pertinent labs & imaging results that were available during my care of the patient were reviewed by me and considered in my medical decision making (see chart for details).     Medically clear.  TTS to evaluate.  Patient will need to be restarted on her home medications.  May benefit from acute hospitalization for stabilization.  Final Clinical Impressions(s) / ED Diagnoses   Final diagnoses:  None    New Prescriptions New Prescriptions   No medications on file     Judy Bilis, MD 05/18/17 1236

## 2017-05-18 NOTE — ED Triage Notes (Signed)
Pt verbalizes had panic attack at work on Monday; since feels anxious, having headaches, and feels sad. Pt denies SI/HI but verbalizes multiple stressors with work, family,and bills.

## 2017-05-18 NOTE — BH Assessment (Addendum)
Assessment Note  Judy Melton is an 41 y.o. female. Pt denies SI/HI and AVH. Pt reports numerous stressors. Pt states her nephew is dying, she recently found out she could no longer have children, and other family issues. Pt states she has a therapist with [facility] but has not been able to have an appointment a psychiatrist. The Pt states she has not had her medications. Pt reports increased anxiety and panic. Pt reports 1 prior inpatient hospitalization for SI attempt. Pt states she is in need of a psychiatrist for medication management. The Pt denies SA. Pt's Marissa Calamity Team Lead Regan Rakers accompanied the Pt to [facility]. Per Ms. Brooke Dare the Pt has difficulty handling stress and she is under a lot of stress. Ms. Brooke Dare states that the Pt is in need of a psychiatrist and medication management. Ms. Brooke Dare states she does not feel that the Pt is a danger to herself. Ms. Brooke Dare states that she feels comfortable with the Pt returning home following-up with her therapist and a psychiatrist. Per Ms. Brooke Dare she and her staff check on the Pt weekly, monitor her at work, and take her to her mental health appointments.   Per Catha Nottingham, DNP Pt does not meet inpatient criteria. Recommends follow-up with therapist and scheduling an appointment with a psychiatrist at [facility] [place]. Pt was D/C with a prescription for Vistaril.   Diagnosis:  F33.1 MDD  Past Medical History:  Past Medical History:  Diagnosis Date  . Anxiety   . Bipolar 1 disorder (HCC)   . Paranoid schizophrenia (HCC)   . PTSD (post-traumatic stress disorder)     History reviewed. No pertinent surgical history.  Family History: No family history on file.  Social History:  reports that she has never smoked. She has never used smokeless tobacco. She reports that she does not drink alcohol or use drugs.  Additional Social History:  Alcohol / Drug Use Pain Medications: please see mar Prescriptions: please see  mar Over the Counter: please see mar History of alcohol / drug use?: No history of alcohol / drug abuse Longest period of sobriety (when/how long): NA  CIWA: CIWA-Ar BP: 126/87 Pulse Rate: 95 COWS:    Allergies: No Known Allergies  Home Medications:  (Not in a hospital admission)  OB/GYN Status:  No LMP recorded.  General Assessment Data Location of Assessment: [facility] TTS Assessment: In system Is this a Tele or Face-to-Face Assessment?: Face-to-Face Is this an Initial Assessment or a Re-assessment for this encounter?: Initial Assessment Marital status: Single Maiden name: NA Is patient pregnant?: No Pregnancy Status: No Living Arrangements: Non-relatives/Friends Can pt return to current living arrangement?: Yes Admission Status: Voluntary Is patient capable of signing voluntary admission?: Yes Referral Source: Self/Family/Friend Insurance type: Medicaid     Crisis Care Plan Living Arrangements: Non-relatives/Friends Legal Guardian: Other: (self) Name of Therapist: [facility]  Education Status Is patient currently in school?: No Current Grade: NA Highest grade of school patient has completed: some college Name of school: NA Contact person: NA  Risk to self with the past 6 months Suicidal Ideation: No Has patient been a risk to self within the past 6 months prior to admission? : No Suicidal Intent: No Has patient had any suicidal intent within the past 6 months prior to admission? : No Is patient at risk for suicide?: No Suicidal Plan?: No Has patient had any suicidal plan within the past 6 months prior to admission? :  No Access to Means: No What has been your use of drugs/alcohol within the last 12 months?: NA Previous Attempts/Gestures: Yes How many times?: 1 Other Self Harm Risks: NA Triggers for Past Attempts: None known Intentional Self Injurious Behavior: None Family Suicide History: No Recent stressful life event(s): Conflict  (Comment), Loss (Comment) Persecutory voices/beliefs?: No Depression: Yes Depression Symptoms: Tearfulness, Feeling angry/irritable Substance abuse history and/or treatment for substance abuse?: No Suicide prevention information given to non-admitted patients: Not applicable  Risk to Others within the past 6 months Homicidal Ideation: No Does patient have any lifetime risk of violence toward others beyond the six months prior to admission? : No Thoughts of Harm to Others: No Current Homicidal Intent: No Current Homicidal Plan: No Access to Homicidal Means: No Identified Victim: NA History of harm to others?: No Assessment of Violence: None Noted Violent Behavior Description: NA Does patient have access to weapons?: No Criminal Charges Pending?: No Does patient have a court date: No Is patient on probation?: No  Psychosis Hallucinations: None noted Delusions: None noted  Mental Status Report Appearance/Hygiene: Unremarkable Eye Contact: Fair Motor Activity: Freedom of movement Speech: Logical/coherent Level of Consciousness: Alert Mood: Depressed Affect: Appropriate to circumstance Anxiety Level: Moderate Thought Processes: Coherent, Relevant Judgement: Unimpaired Orientation: Person, Place, Time, Situation Obsessive Compulsive Thoughts/Behaviors: None  Cognitive Functioning Concentration: Normal Memory: Recent Intact, Remote Intact IQ: Average Insight: Fair Impulse Control: Fair Appetite: Fair Weight Loss: 0 Weight Gain: 0 Sleep: No Change Total Hours of Sleep: 8 Vegetative Symptoms: None  ADLScreening [facility]) Patient's cognitive ability adequate to safely complete daily activities?: Yes Patient able to express need for assistance with ADLs?: Yes Independently performs ADLs?: Yes (appropriate for developmental age)  Prior Inpatient Therapy Prior Inpatient Therapy: Yes Prior Therapy Dates: unknown Prior Therapy Facilty/Provider(s):  unknown Reason for Treatment: unknown  Prior Outpatient Therapy Prior Outpatient Therapy: Yes Prior Therapy Dates: current Prior Therapy Facilty/Provider(s): [facility] Reason for Treatment: depression and anxiety Does patient have an ACCT team?: No Does patient have Intensive In-House Services?  : No Does patient have [facility] services? : No Does patient have P4CC services?: No  ADL Screening (condition at time of admission) Patient's cognitive ability adequate to safely complete daily activities?: Yes Is the patient deaf or have difficulty hearing?: No Does the patient have difficulty seeing, even when wearing glasses/contacts?: No Does the patient have difficulty concentrating, remembering, or making decisions?: No Patient able to express need for assistance with ADLs?: Yes Does the patient have difficulty dressing or bathing?: No Independently performs ADLs?: Yes (appropriate for developmental age) Does the patient have difficulty walking or climbing stairs?: No Weakness of Legs: None Weakness of Arms/Hands: None       Abuse/Neglect Assessment (Assessment to be complete while patient is alone) Physical Abuse: Denies Verbal Abuse: Denies Sexual Abuse: Denies Exploitation of patient/patient's resources: Denies Self-Neglect: Denies     Merchant navy officerAdvance Directives (For Healthcare) Does Patient Have a Medical Advance Directive?: No    Additional Information 1:1 In Past 12 Months?: No CIRT Risk: No Elopement Risk: No Does patient have medical clearance?: Yes     Disposition:  Disposition Initial Assessment Completed for this Encounter: Yes Disposition of Patient: Outpatient treatment Type of outpatient treatment: Adult  On Site Evaluation by:   Reviewed with Physician:    Emmit PomfretLevette,Licet Dunphy D 05/18/2017 1:52 PM

## 2017-05-18 NOTE — ED Notes (Signed)
Per flex nurse pt. Will not have labs drawn due to being discharged. Nurses aware.

## 2017-06-13 ENCOUNTER — Other Ambulatory Visit (INDEPENDENT_AMBULATORY_CARE_PROVIDER_SITE_OTHER): Payer: Self-pay | Admitting: Orthopaedic Surgery

## 2017-06-13 ENCOUNTER — Other Ambulatory Visit: Payer: Self-pay | Admitting: Family Medicine

## 2017-06-13 DIAGNOSIS — K2100 Gastro-esophageal reflux disease with esophagitis, without bleeding: Secondary | ICD-10-CM

## 2017-06-13 NOTE — Telephone Encounter (Signed)
Please advise 

## 2017-07-05 ENCOUNTER — Other Ambulatory Visit: Payer: Self-pay | Admitting: Family Medicine

## 2017-07-05 DIAGNOSIS — K2100 Gastro-esophageal reflux disease with esophagitis, without bleeding: Secondary | ICD-10-CM

## 2017-07-11 ENCOUNTER — Emergency Department (HOSPITAL_COMMUNITY)
Admission: EM | Admit: 2017-07-11 | Discharge: 2017-07-11 | Disposition: A | Discharge status: Home / Self Care | Payer: Medicaid Other | Attending: Emergency Medicine | Admitting: Emergency Medicine

## 2017-07-11 ENCOUNTER — Encounter (HOSPITAL_COMMUNITY): Payer: Self-pay | Admitting: Family Medicine

## 2017-07-11 DIAGNOSIS — M549 Dorsalgia, unspecified: Secondary | ICD-10-CM | POA: Insufficient documentation

## 2017-07-11 DIAGNOSIS — Z5321 Procedure and treatment not carried out due to patient leaving prior to being seen by health care provider: Secondary | ICD-10-CM | POA: Diagnosis not present

## 2017-07-11 NOTE — ED Triage Notes (Signed)
Patient was picked from work and [facility]. Patient raised her right arm and felt a sharp pinch in her right thorac area. Standing makes it feel better. Denies any recent injury but had lumbar injury several years ago.

## 2017-07-11 NOTE — ED Notes (Signed)
Called for pt 3 times no answer.  

## 2017-07-11 NOTE — ED Notes (Signed)
Called for pt x 2 to room. No answer.

## 2017-07-11 NOTE — ED Triage Notes (Signed)
Patient reports her co-worker place bio-freeze to the back pain.

## 2017-07-27 ENCOUNTER — Ambulatory Visit: Payer: Medicaid Other | Attending: Family Medicine | Admitting: Physician Assistant

## 2017-07-27 ENCOUNTER — Other Ambulatory Visit: Payer: Self-pay

## 2017-07-27 VITALS — BP 103/72 | HR 90 | Temp 98.7°F | Resp 12 | Ht 64.0 in | Wt 205.0 lb

## 2017-07-27 DIAGNOSIS — S39012A Strain of muscle, fascia and tendon of lower back, initial encounter: Secondary | ICD-10-CM | POA: Diagnosis not present

## 2017-07-27 DIAGNOSIS — N912 Amenorrhea, unspecified: Secondary | ICD-10-CM | POA: Insufficient documentation

## 2017-07-27 DIAGNOSIS — X58XXXA Exposure to other specified factors, initial encounter: Secondary | ICD-10-CM | POA: Diagnosis not present

## 2017-07-27 LAB — POCT URINALYSIS DIPSTICK
BILIRUBIN UA: NEGATIVE
Blood, UA: NEGATIVE
GLUCOSE UA: NEGATIVE
Ketones, UA: NEGATIVE
Leukocytes, UA: NEGATIVE
Nitrite, UA: NEGATIVE
PH UA: 8.5 — AB (ref 5.0–8.0)
Protein, UA: NEGATIVE
Spec Grav, UA: 1.015 (ref 1.010–1.025)
UROBILINOGEN UA: 0.2 U/dL

## 2017-07-27 LAB — POCT URINE PREGNANCY: Preg Test, Ur: NEGATIVE

## 2017-07-27 MED ORDER — MELOXICAM 7.5 MG PO TABS: 7.5000 mg | ORAL_TABLET | Freq: Two times a day (BID) | ORAL | 1 refills | Status: DC | PRN

## 2017-07-27 NOTE — Progress Notes (Signed)
F/u back spasms  Unable to take medications d/t mental health meds. Taken bio-freeze and meloxicam and Rest

## 2017-07-27 NOTE — Progress Notes (Signed)
Patient ID: Judy Melton, female   DOB: 02-Oct-1975, 42 y.o.   MRN: [id number]      Judy Melton, is a 42 y.o. female  [id number]:[id number]  [id number]  DOB - 1975-10-30  Subjective:  Chief Complaint and HPI: Judy Melton is a 42 y.o. female here today for LBP; R>L.  Pain in lower back a couple of weeks.  She initially called EMS from work and was taken to the ED on 07/11/2017.  The wait was too long and she left without being seen.  On 07/12/2017, she went to an urgent care and they prescribed meloxicam.  She does seem to be improving. However, they told her at the urgent care that she would need xrays and she really wants to proceed with that.  She denies numbness, weakness.  No radiating pain.  She denies urinary s/sx.  NKI.  The only events precipitating the onset of the pain was that she stayed at the hospital a lot with a sick nephew leading up to the time that the back pain started.    ROS:   Constitutional:  No f/c, No night sweats, No unexplained weight loss. EENT:  No vision changes, No blurry vision, No hearing changes. No mouth, throat, or ear problems.  Respiratory: No cough, No SOB Cardiac: No CP, no palpitations GI:  No abd pain, No N/V/D. GU: No Urinary s/sx Musculoskeletal: +LBP Neuro: No headache, no dizziness, no motor weakness.  Skin: No rash Endocrine:  No polydipsia. No polyuria.  Psych: Denies SI/HI  No problems updated.  ALLERGIES: No Known Allergies  PAST MEDICAL HISTORY: Past Medical History:  Diagnosis Date  . Anxiety   . Bipolar 1 disorder (HCC)   . Paranoid schizophrenia (HCC)   . PTSD (post-traumatic stress disorder)     MEDICATIONS AT HOME: Prior to Admission medications   Medication Sig Start Date End Date Taking? Authorizing Provider  hydrOXYzine (ATARAX/VISTARIL) 25 MG tablet Take 1 tablet (25 mg total) by mouth every 6 (six) hours as needed for anxiety. 05/18/17  Yes Charm Rings, NP  meloxicam (MOBIC) 7.5 MG tablet Take 1  tablet (7.5 mg total) by mouth 2 (two) times daily between meals as needed for pain. 07/27/17  Yes McClung, Marzella Schlein, PA-C  paliperidone (INVEGA) 3 MG 24 hr tablet Take 3 mg by mouth every 30 (thirty) days.   Yes [provider]  polyethylene glycol powder (GLYCOLAX/MIRALAX) powder Take 17 g by mouth 2 (two) times daily as needed. Patient taking differently: Take 17 g by mouth 2 (two) times daily as needed for mild constipation.  10/13/16  Yes Jaclyn Shaggy, MD  acetaminophen (TYLENOL) 500 MG tablet Take 1,000 mg by mouth every 6 (six) hours as needed for mild pain, moderate pain, fever or headache.    [provider]  omeprazole (PRILOSEC) 20 MG capsule TAKE 1 CAPSULE (20 MG TOTAL) BY MOUTH DAILY. Patient not taking: Reported on 05/18/2017 03/07/17   Jaclyn Shaggy, MD  tiZANidine (ZANAFLEX) 4 MG tablet TAKE 1 TABLET (4 MG TOTAL) BY MOUTH 2 (TWO) TIMES DAILY AS NEEDED FOR MUSCLE SPASMS. Patient not taking: Reported on 07/27/2017 06/13/17   Kathryne Hitch, MD     Objective:  EXAM:   Vitals:   07/27/17 1333  BP: 103/72  Pulse: 90  Resp: 12  Temp: 98.7 F (37.1 C)  TempSrc: Oral  Weight: 205 lb (93 kg)  Height: 5\' 4"  (1.626 m)    General appearance : A&OX3. NAD. Non-toxic-appearing HEENT: Atraumatic and  Normocephalic.  PERRLA. EOM intact.   Neck: supple, no JVD. No cervical lymphadenopathy. No thyromegaly Chest/Lungs:  Breathing-non-labored, Good air entry bilaterally, breath sounds normal without rales, rhonchi, or wheezing  CVS: S1 S2 regular, no murmurs, gallops, rubs  Extremities: Bilateral Lower Ext shows no edema, both legs are warm to touch with = pulse throughout Back:  ROM ~90% of normal.  No CVA TTP.  Neg SLR B.  DTR =BLE.   Neurology:  CN II-XII grossly intact, Non focal.   Psych:  TP linear. J/I WNL. Normal speech. Appropriate eye contact and affect.  Skin:  No Rash  Data Review No results found for: HGBA1C   Assessment & Plan   1. Strain of  lumbar region, initial encounter No red flags-will do x-rays per patient request though not likely necessary.   - meloxicam (MOBIC) 7.5 MG tablet; Take 1 tablet (7.5 mg total) by mouth 2 (two) times daily between meals as needed for pain.  Dispense: 60 tablet; Refill: 1 - Urinalysis Dipstick - DG Lumbar Spine 2-3 Views; Future  2. Amenorrhea R/O pregnancy prior to xray - POCT urine pregnancy - Urinalysis Dipstick   Patient have been counseled extensively about nutrition and exercise  Return in about 3 months (around 10/25/2017) for Dr Alvis LemmingsNewlin.  The patient was given clear instructions to go to ER or return to medical center if symptoms don't improve, worsen or new problems develop. The patient verbalized understanding. The patient was told to call to get lab results if they haven't heard anything in the next week.     Georgian CoAngela McClung, PA-C [facility] [place], [place] [phone]   07/27/2017, 2:07 PM

## 2017-09-06 ENCOUNTER — Encounter: Payer: Self-pay | Admitting: Nurse Practitioner

## 2017-09-06 ENCOUNTER — Ambulatory Visit: Payer: Medicaid Other | Attending: Nurse Practitioner | Admitting: Nurse Practitioner

## 2017-09-06 VITALS — BP 100/66 | HR 101 | Temp 98.2°F | Ht 64.0 in | Wt 204.4 lb

## 2017-09-06 DIAGNOSIS — F200 Paranoid schizophrenia: Secondary | ICD-10-CM | POA: Insufficient documentation

## 2017-09-06 DIAGNOSIS — F4310 Post-traumatic stress disorder, unspecified: Secondary | ICD-10-CM | POA: Diagnosis not present

## 2017-09-06 DIAGNOSIS — F319 Bipolar disorder, unspecified: Secondary | ICD-10-CM | POA: Diagnosis not present

## 2017-09-06 DIAGNOSIS — R42 Dizziness and giddiness: Secondary | ICD-10-CM

## 2017-09-06 NOTE — Patient Instructions (Addendum)
Dizziness °Dizziness is a common problem. It is a feeling of unsteadiness or light-headedness. You may feel like you are about to faint. Dizziness can lead to injury if you stumble or fall. Anyone can become dizzy, but dizziness is more common in older adults. This condition can be caused by a number of things, including medicines, dehydration, or illness. °Follow these instructions at home: °Eating and drinking °· Drink enough fluid to keep your urine clear or pale yellow. This helps to keep you from becoming dehydrated. Try to drink more clear fluids, such as water. °· Do not drink alcohol. °· Limit your caffeine intake if told to do so by your health care provider. Check ingredients and nutrition facts to see if a food or beverage contains caffeine. °· Limit your salt (sodium) intake if told to do so by your health care provider. Check ingredients and nutrition facts to see if a food or beverage contains sodium. °Activity °· Avoid making quick movements. °? Rise slowly from chairs and steady yourself until you feel okay. °? In the morning, first sit up on the side of the bed. When you feel okay, stand slowly while you hold onto something until you know that your balance is fine. °· If you need to stand in one place for a long time, move your legs often. Tighten and relax the muscles in your legs while you are standing. °· Do not drive or use heavy machinery if you feel dizzy. °· Avoid bending down if you feel dizzy. Place items in your home so that they are easy for you to reach without leaning over. °Lifestyle °· Do not use any products that contain nicotine or tobacco, such as cigarettes and e-cigarettes. If you need help quitting, ask your health care provider. °· Try to reduce your stress level by using methods such as yoga or meditation. Talk with your health care provider if you need help to manage your stress. °General instructions °· Watch your dizziness for any changes. °· Take over-the-counter and  prescription medicines only as told by your health care provider. Talk with your health care provider if you think that your dizziness is caused by a medicine that you are taking. °· Tell a friend or a family member that you are feeling dizzy. If he or she notices any changes in your behavior, have this person call your health care provider. °· Keep all follow-up visits as told by your health care provider. This is important. °Contact a health care provider if: °· Your dizziness does not go away. °· Your dizziness or light-headedness gets worse. °· You feel nauseous. °· You have reduced hearing. °· You have new symptoms. °· You are unsteady on your feet or you feel like the room is spinning. °Get help right away if: °· You vomit or have diarrhea and are unable to eat or drink anything. °· You have problems talking, walking, swallowing, or using your arms, hands, or legs. °· You feel generally weak. °· You are not thinking clearly or you have trouble forming sentences. It may take a friend or family member to notice this. °· You have chest pain, abdominal pain, shortness of breath, or sweating. °· Your vision changes. °· You have any bleeding. °· You have a severe headache. °· You have neck pain or a stiff neck. °· You have a fever. °These symptoms may represent a serious problem that is an emergency. Do not wait to see if the symptoms will go away. Get medical help   right away. Call your local emergency services (911 in the [place] Do not drive yourself to the hospital. °Summary °· Dizziness is a feeling of unsteadiness or light-headedness. This condition can be caused by a number of things, including medicines, dehydration, or illness. °· Anyone can become dizzy, but dizziness is more common in older adults. °· Drink enough fluid to keep your urine clear or pale yellow. Do not drink alcohol. °· Avoid making quick movements if you feel dizzy. Monitor your dizziness for any changes. °This information is not intended to  replace advice given to you by your health care provider. Make sure you discuss any questions you have with your health care provider. °Document Released: 01/05/2001 Document Revised: 08/14/2016 Document Reviewed: 08/14/2016 °Elsevier Interactive Patient Education © 2018 Elsevier Inc[employer] ° °

## 2017-09-06 NOTE — Progress Notes (Signed)
Assessment & Plan:  Judy Melton was seen today for dizziness.  Lightheadedness Exam is benign. Could have possibly taken an increased dose of vistaril. Instructed patient to hold any additional doses of vistaril for the next 48 hours and only take as prescribed. Make sure she is drinking plenty of water to stay hydrated. Instructed patient to call the office if she is not feeling better in 48 hours.     Patient has been counseled on age-appropriate routine health concerns for screening and prevention. These are reviewed and up-to-date. Referrals have been placed accordingly. Immunizations are up-to-date or declined.    Subjective:   Chief Complaint  Patient presents with  . Dizziness    Patient is here for light headed and dizziness. Patient stated she took Vistaril and it her feel very sleepy. Patient stated she feels a little light headed at the moment. Patient stated it started Saturday and think it may due to lack of sleep and dehydration.   Judy Melton S Bergfeld 42 y.o. female presents to office today with complaints of lightheadedness and dizziness. She endorses taking vistaril over the weekend for anxiety symptoms. Since taking it she has become more drowsy with hypersomnolence. She denies taking more than the prescribed dosage. She denies any falls. She also states " I work at [employer] and I may have caught the flu from kids. I called out of work this weekend so I will need a work note". When I ask what flu symptoms she has been experiencing she endorses fatigue but no other flu like symptoms such as fever, rhinorrhea, cough or sore throat.   Dizziness  This is a new problem. The current episode started in the past 7 days. The problem occurs intermittently. The problem has been gradually improving. Associated symptoms include fatigue and headaches. Pertinent negatives include no abdominal pain, chest pain, chills, congestion, coughing, fever, myalgias, nausea, neck pain, numbness,  sore throat, visual change or vomiting. Nothing aggravates the symptoms. She has tried nothing for the symptoms. The treatment provided mild relief.     Review of Systems  Constitutional: Positive for fatigue and malaise/fatigue. Negative for chills and fever.  HENT: Negative for congestion, sinus pain and sore throat.   Eyes: Negative.   Respiratory: Negative for cough, sputum production and shortness of breath.   Cardiovascular: Negative for chest pain and orthopnea.  Gastrointestinal: Negative for abdominal pain, blood in stool, melena, nausea and vomiting.  Genitourinary: Negative.  Negative for dysuria, flank pain, frequency, hematuria and urgency.  Musculoskeletal: Negative for back pain, myalgias and neck pain.  Neurological: Positive for dizziness and headaches. Negative for tingling, sensory change, speech change, focal weakness and numbness.  Psychiatric/Behavioral: Negative.     Past Medical History:  Diagnosis Date  . Anxiety   . Bipolar 1 disorder (HCC)   . Paranoid schizophrenia (HCC)   . PTSD (post-traumatic stress disorder)     History reviewed. No pertinent surgical history.  History reviewed. No pertinent family history.  Social History Reviewed with no changes to be made today.   Outpatient Medications Prior to Visit  Medication Sig Dispense Refill  . acetaminophen (TYLENOL) 500 MG tablet Take 1,000 mg by mouth every 6 (six) hours as needed for mild pain, moderate pain, fever or headache.    . hydrOXYzine (ATARAX/VISTARIL) 25 MG tablet Take 1 tablet (25 mg total) by mouth every 6 (six) hours as needed for anxiety. 30 tablet 0  . meloxicam (MOBIC) 7.5 MG tablet Take 1 tablet (7.5 mg  total) by mouth 2 (two) times daily between meals as needed for pain. 60 tablet 1  . MULTIPLE VITAMINS PO Take by mouth.    . paliperidone (INVEGA) 3 MG 24 hr tablet Take 3 mg by mouth every 30 (thirty) days.    . polyethylene glycol powder (GLYCOLAX/MIRALAX) powder Take 17 g by  mouth 2 (two) times daily as needed. (Patient taking differently: Take 17 g by mouth 2 (two) times daily as needed for mild constipation. ) 3350 g 1  . omeprazole (PRILOSEC) 20 MG capsule TAKE 1 CAPSULE (20 MG TOTAL) BY MOUTH DAILY. (Patient not taking: Reported on 05/18/2017) 30 capsule 0  . tiZANidine (ZANAFLEX) 4 MG tablet TAKE 1 TABLET (4 MG TOTAL) BY MOUTH 2 (TWO) TIMES DAILY AS NEEDED FOR MUSCLE SPASMS. (Patient not taking: Reported on 07/27/2017) 60 tablet 0   No facility-administered medications prior to visit.     No Known Allergies     Objective:    BP 100/66 (BP Location: Right Arm, Patient Position: Sitting, Cuff Size: Large)   Pulse (!) 101   Temp 98.2 F (36.8 C) (Oral)   Ht 5\' 4"  (1.626 m)   Wt 204 lb 6.4 oz (92.7 kg)   SpO2 97%   BMI 35.09 kg/m  Wt Readings from Last 3 Encounters:  09/06/17 204 lb 6.4 oz (92.7 kg)  07/27/17 205 lb (93 kg)  07/11/17 206 lb (93.4 kg)    Physical Exam  Constitutional: She is oriented to person, place, and time. She appears well-developed and well-nourished.  HENT:  Head: Normocephalic.  Mouth/Throat: Uvula is midline, oropharynx is clear and moist and mucous membranes are normal. Mucous membranes are not pale.  Eyes: Conjunctivae and EOM are normal. Pupils are equal, round, and reactive to light.  Neck: Normal range of motion. No thyromegaly present.  Cardiovascular: Regular rhythm and normal heart sounds. Tachycardia present.  No murmur heard. Pulmonary/Chest: Effort normal and breath sounds normal.  Musculoskeletal: Normal range of motion.  Lymphadenopathy:    She has no cervical adenopathy.  Neurological: She is alert and oriented to person, place, and time. She has normal strength. She displays no tremor. No cranial nerve deficit or sensory deficit. She displays a negative Romberg sign. She displays no seizure activity. Coordination and gait normal.  Skin: Skin is warm and dry.  Good skin turgor  Psychiatric: She has a normal  mood and affect. Her behavior is normal. Thought content normal.        Patient has been counseled extensively about nutrition and exercise as well as the importance of adherence with medications and regular follow-up. The patient was given clear instructions to go to ER or return to medical center if symptoms don't improve, worsen or new problems develop. The patient verbalized understanding.   Follow-up: No Follow-up on file.   Claiborne Rigg, FNP-BC [facility] [place], [place] [phone]   09/07/2017, 11:29 PM

## 2017-09-07 ENCOUNTER — Encounter: Payer: Self-pay | Admitting: Nurse Practitioner

## 2017-09-09 ENCOUNTER — Other Ambulatory Visit: Payer: Self-pay

## 2017-09-13 ENCOUNTER — Ambulatory Visit: Payer: Medicaid Other | Attending: Family Medicine

## 2017-09-13 ENCOUNTER — Other Ambulatory Visit: Payer: Self-pay | Admitting: Nurse Practitioner

## 2017-09-13 DIAGNOSIS — E669 Obesity, unspecified: Secondary | ICD-10-CM

## 2017-09-13 DIAGNOSIS — R42 Dizziness and giddiness: Secondary | ICD-10-CM | POA: Diagnosis not present

## 2017-09-13 NOTE — Addendum Note (Signed)
Addended by: Paschal DoppWHITE, Simone Rodenbeck J on: 09/13/2017 10:28 AM   Modules accepted: Orders

## 2017-09-13 NOTE — Progress Notes (Signed)
Patient here for lab visit only 

## 2017-09-14 LAB — CMP14+EGFR
A/G RATIO: 1.1 — AB (ref 1.2–2.2)
ALBUMIN: 4 g/dL (ref 3.5–5.5)
ALT: 19 IU/L (ref 0–32)
AST: 28 IU/L (ref 0–40)
Alkaline Phosphatase: 107 IU/L (ref 39–117)
BILIRUBIN TOTAL: 0.3 mg/dL (ref 0.0–1.2)
BUN / CREAT RATIO: 10 (ref 9–23)
BUN: 10 mg/dL (ref 6–24)
CALCIUM: 9.6 mg/dL (ref 8.7–10.2)
CHLORIDE: 106 mmol/L (ref 96–106)
CO2: 17 mmol/L — ABNORMAL LOW (ref 20–29)
Creatinine, Ser: 0.97 mg/dL (ref 0.57–1.00)
GFR, EST AFRICAN AMERICAN: 84 mL/min/{1.73_m2} (ref >59)
GFR, EST NON AFRICAN AMERICAN: 73 mL/min/{1.73_m2} (ref >59)
Globulin, Total: 3.7 g/dL (ref 1.5–4.5)
Glucose: 95 mg/dL (ref 65–99)
POTASSIUM: 4.6 mmol/L (ref 3.5–5.2)
Sodium: 144 mmol/L (ref 134–144)
TOTAL PROTEIN: 7.7 g/dL (ref 6.0–8.5)

## 2017-09-14 LAB — LIPID PANEL
CHOLESTEROL TOTAL: 193 mg/dL (ref 100–199)
Chol/HDL Ratio: 3.6 ratio (ref 0.0–4.4)
HDL: 53 mg/dL (ref >39)
LDL Calculated: 127 mg/dL — ABNORMAL HIGH (ref 0–99)
Triglycerides: 64 mg/dL (ref 0–149)
VLDL Cholesterol Cal: 13 mg/dL (ref 5–40)

## 2017-10-21 ENCOUNTER — Encounter: Payer: Self-pay | Admitting: Family Medicine

## 2017-10-21 ENCOUNTER — Ambulatory Visit: Payer: Medicaid Other | Attending: Family Medicine | Admitting: Family Medicine

## 2017-10-21 VITALS — BP 113/72 | HR 90 | Temp 97.9°F | Ht 64.0 in | Wt 202.0 lb

## 2017-10-21 DIAGNOSIS — X58XXXA Exposure to other specified factors, initial encounter: Secondary | ICD-10-CM | POA: Diagnosis not present

## 2017-10-21 DIAGNOSIS — F200 Paranoid schizophrenia: Secondary | ICD-10-CM | POA: Insufficient documentation

## 2017-10-21 DIAGNOSIS — Z79899 Other long term (current) drug therapy: Secondary | ICD-10-CM | POA: Diagnosis not present

## 2017-10-21 DIAGNOSIS — S39012A Strain of muscle, fascia and tendon of lower back, initial encounter: Secondary | ICD-10-CM | POA: Diagnosis not present

## 2017-10-21 DIAGNOSIS — N912 Amenorrhea, unspecified: Secondary | ICD-10-CM | POA: Insufficient documentation

## 2017-10-21 DIAGNOSIS — M545 Low back pain: Secondary | ICD-10-CM | POA: Diagnosis present

## 2017-10-21 DIAGNOSIS — F4310 Post-traumatic stress disorder, unspecified: Secondary | ICD-10-CM | POA: Insufficient documentation

## 2017-10-21 DIAGNOSIS — S39012S Strain of muscle, fascia and tendon of lower back, sequela: Secondary | ICD-10-CM

## 2017-10-21 DIAGNOSIS — F319 Bipolar disorder, unspecified: Secondary | ICD-10-CM | POA: Diagnosis not present

## 2017-10-21 MED ORDER — METHOCARBAMOL 750 MG PO TABS: 750.0000 mg | ORAL_TABLET | Freq: Four times a day (QID) | ORAL | 2 refills | Status: DC

## 2017-10-21 MED ORDER — MELOXICAM 7.5 MG PO TABS: 7.5000 mg | ORAL_TABLET | Freq: Every day | ORAL | 2 refills | Status: DC

## 2017-10-21 NOTE — Progress Notes (Signed)
Subjective:  Patient ID: Judy Rocherasheeda S Hanel, female    DOB: Aug 08, 1975  Age: 42 y.o. MRN: [id number]  CC: Back Pain and Anxiety   HPI Judy RocherRasheeda S Melton is a 42 year old female with a history of schizophrenia, bipolar disorder seen by the physician assistant at her last visit for low back pain who comes into the clinic today for follow-up visit. She started a new job 2 months ago where she is on her feet most of the day and was prescribed NSAIDs and Tizanidine however she has been taking meloxicam in the morning and ibuprofen at midday with improvement in symptoms.  She has not been taking the tizanidine because she was informed at the hospital  "it interacts with her hydroxyzine". Pain is described as mild and does not radiate down her lower extremities. Lumbar spine x-ray was ordered at her last visit which she is yet to undergo.  She is also concerned about not having a period for the last 3 months and has not been sexually active in this timeframe and is not currently on contraceptives.  Prior to this she had a normal menstrual period and is wondering if she is going through menopause.  She currently takes hydroxyzine and Invega for management of her bipolar disorder and schizophrenia and is currently under the care of a psychiatrist and undergoes therapy sessions every week.  Past Medical History:  Diagnosis Date  . Anxiety   . Bipolar 1 disorder (HCC)   . Paranoid schizophrenia (HCC)   . PTSD (post-traumatic stress disorder)     History reviewed. No pertinent surgical history.  No Known Allergies   Outpatient Medications Prior to Visit  Medication Sig Dispense Refill  . hydrOXYzine (ATARAX/VISTARIL) 25 MG tablet Take 1 tablet (25 mg total) by mouth every 6 (six) hours as needed for anxiety. 30 tablet 0  . MULTIPLE VITAMINS PO Take by mouth.    . paliperidone (INVEGA) 3 MG 24 hr tablet Take 3 mg by mouth every 30 (thirty) days.    . polyethylene glycol powder  (GLYCOLAX/MIRALAX) powder Take 17 g by mouth 2 (two) times daily as needed. 3350 g 1  . meloxicam (MOBIC) 7.5 MG tablet Take 1 tablet (7.5 mg total) by mouth 2 (two) times daily between meals as needed for pain. 60 tablet 1  . acetaminophen (TYLENOL) 500 MG tablet Take 1,000 mg by mouth every 6 (six) hours as needed for mild pain, moderate pain, fever or headache.    Marland Kitchen. omeprazole (PRILOSEC) 20 MG capsule TAKE 1 CAPSULE (20 MG TOTAL) BY MOUTH DAILY. (Patient not taking: Reported on 05/18/2017) 30 capsule 0  . tiZANidine (ZANAFLEX) 4 MG tablet TAKE 1 TABLET (4 MG TOTAL) BY MOUTH 2 (TWO) TIMES DAILY AS NEEDED FOR MUSCLE SPASMS. (Patient not taking: Reported on 07/27/2017) 60 tablet 0   No facility-administered medications prior to visit.     ROS Review of Systems  Constitutional: Negative for activity change, appetite change and fatigue.  HENT: Negative for congestion, sinus pressure and sore throat.   Eyes: Negative for visual disturbance.  Respiratory: Negative for cough, chest tightness, shortness of breath and wheezing.   Cardiovascular: Negative for chest pain and palpitations.  Gastrointestinal: Negative for abdominal distention, abdominal pain and constipation.  Endocrine: Negative for polydipsia.  Genitourinary: Positive for menstrual problem. Negative for dysuria and frequency.  Musculoskeletal: Positive for back pain. Negative for arthralgias.  Skin: Negative for rash.  Neurological: Negative for tremors, light-headedness and numbness.  Hematological: Does not bruise/bleed easily.  Psychiatric/Behavioral: Negative for agitation and behavioral problems.    Objective:  BP 113/72   Pulse 90   Temp 97.9 F (36.6 C) (Oral)   Ht 5\' 4"  (1.626 m)   Wt 202 lb (91.6 kg)   SpO2 98%   BMI 34.67 kg/m   BP/Weight 10/21/2017 09/06/2017 07/27/2017  Systolic BP 113 100 103  Diastolic BP 72 66 72  Wt. (Lbs) 202 204.4 205  BMI 34.67 35.09 35.19      Physical Exam  Constitutional: She is  oriented to person, place, and time. She appears well-developed and well-nourished.  Cardiovascular: Normal rate, normal heart sounds and intact distal pulses.  No murmur heard. Pulmonary/Chest: Effort normal and breath sounds normal. She has no wheezes. She has no rales. She exhibits no tenderness.  Abdominal: Soft. Bowel sounds are normal. She exhibits no distension and no mass. There is no tenderness.  Musculoskeletal: Normal range of motion. She exhibits no edema or tenderness.  Negative straight leg raise bilaterally  Neurological: She is alert and oriented to person, place, and time.  Skin: Skin is warm and dry.  Psychiatric: She has a normal mood and affect.     Assessment & Plan:   1. Strain of lumbar region, initial encounter Could be positional as she does a lot of standing at her job Advised to apply heat Yet to undergo lumbar spine x-ray which was ordered by physician assistant Advised against using ibuprofen and meloxicam - meloxicam (MOBIC) 7.5 MG tablet; Take 1 tablet (7.5 mg total) by mouth daily.  Dispense: 30 tablet; Refill: 2 - methocarbamol (ROBAXIN-750) 750 MG tablet; Take 1 tablet (750 mg total) by mouth 4 (four) times daily.  Dispense: 60 tablet; Refill: 2  2. Amenorrhea LMP was 3 months ago, no recent sexual activity, not on contraceptives - FSH/LH - Estradiol  3. Bipolar 1 disorder (HCC) Stable  4. Paranoid schizophrenia (HCC) Stable Continue Invega as per mental health   Meds ordered this encounter  Medications  . meloxicam (MOBIC) 7.5 MG tablet    Sig: Take 1 tablet (7.5 mg total) by mouth daily.    Dispense:  30 tablet    Refill:  2  . methocarbamol (ROBAXIN-750) 750 MG tablet    Sig: Take 1 tablet (750 mg total) by mouth 4 (four) times daily.    Dispense:  60 tablet    Refill:  2    Follow-up: Return in about 3 months (around 01/21/2018) for follow up of chronic medical conditions.   Hoy Register MD

## 2017-10-22 LAB — FSH/LH
FSH: 2.6 m[IU]/mL
LH: 0.3 m[IU]/mL

## 2017-10-22 LAB — ESTRADIOL: Estradiol: 11.1 pg/mL

## 2017-10-23 ENCOUNTER — Other Ambulatory Visit: Payer: Self-pay | Admitting: Physician Assistant

## 2017-10-23 DIAGNOSIS — S39012A Strain of muscle, fascia and tendon of lower back, initial encounter: Secondary | ICD-10-CM

## 2017-11-17 ENCOUNTER — Ambulatory Visit (HOSPITAL_COMMUNITY)
Admission: RE | Admit: 2017-11-17 | Discharge: 2017-11-17 | Disposition: A | Discharge status: Home / Self Care | Payer: Medicaid Other | Source: Ambulatory Visit | Attending: Physician Assistant | Admitting: Physician Assistant

## 2017-11-17 ENCOUNTER — Ambulatory Visit: Payer: Medicaid Other | Attending: Family Medicine | Admitting: Physician Assistant

## 2017-11-17 VITALS — BP 115/79 | HR 90 | Temp 98.2°F | Resp 16 | Ht 64.0 in | Wt 200.2 lb

## 2017-11-17 DIAGNOSIS — F4310 Post-traumatic stress disorder, unspecified: Secondary | ICD-10-CM | POA: Diagnosis not present

## 2017-11-17 DIAGNOSIS — X58XXXA Exposure to other specified factors, initial encounter: Secondary | ICD-10-CM | POA: Insufficient documentation

## 2017-11-17 DIAGNOSIS — S39012A Strain of muscle, fascia and tendon of lower back, initial encounter: Secondary | ICD-10-CM | POA: Diagnosis not present

## 2017-11-17 DIAGNOSIS — Z79899 Other long term (current) drug therapy: Secondary | ICD-10-CM | POA: Insufficient documentation

## 2017-11-17 DIAGNOSIS — N912 Amenorrhea, unspecified: Secondary | ICD-10-CM | POA: Diagnosis not present

## 2017-11-17 DIAGNOSIS — M546 Pain in thoracic spine: Secondary | ICD-10-CM | POA: Diagnosis present

## 2017-11-17 DIAGNOSIS — M4186 Other forms of scoliosis, lumbar region: Secondary | ICD-10-CM | POA: Insufficient documentation

## 2017-11-17 DIAGNOSIS — F200 Paranoid schizophrenia: Secondary | ICD-10-CM | POA: Insufficient documentation

## 2017-11-17 DIAGNOSIS — F319 Bipolar disorder, unspecified: Secondary | ICD-10-CM | POA: Diagnosis not present

## 2017-11-17 LAB — POCT URINE PREGNANCY: Preg Test, Ur: NEGATIVE

## 2017-11-17 MED ORDER — METHOCARBAMOL 750 MG PO TABS: 750.0000 mg | ORAL_TABLET | Freq: Four times a day (QID) | ORAL | 2 refills | Status: AC

## 2017-11-17 MED ORDER — MELOXICAM 7.5 MG PO TABS: 7.5000 mg | ORAL_TABLET | Freq: Every day | ORAL | 2 refills | Status: DC

## 2017-11-17 NOTE — Progress Notes (Signed)
Patient ID: Judy Melton, female   DOB: 1976-04-14, 42 y.o.   MRN: [id number]      Judy Melton, is a 42 y.o. female  [id number]  [id number]  DOB - 1976-04-14  Subjective:  Chief Complaint and HPI: Judy Melton is a 42 y.o. female here today for LBP/R thoracolumbar pain.  Works at [employer]; a lot of standing, some bending and some lifting.  She has muscle strain and LBP frequently.  She is having trouble standing long periods of time at work. No paresthesias, weakness.  Pain is worse the longer she stands.  She did have some sort of previous injury in 2015 when she was working for [employer] and had to have PT but had no serious complications.  She was seen here in January for LBP and didn't go for xrays.    ROS:   Constitutional:  No f/c, No night sweats, No unexplained weight loss. EENT:  No vision changes, No blurry vision, No hearing changes. No mouth, throat, or ear problems.  Respiratory: No cough, No SOB Cardiac: No CP, no palpitations GI:  No abd pain, No N/V/D. GU: No Urinary s/sx Musculoskeletal: R thoracolumbar pain Neuro: No headache, no dizziness, no motor weakness.  Skin: No rash Endocrine:  No polydipsia. No polyuria.  Psych: Denies SI/HI  No problems updated.  ALLERGIES: No Known Allergies  PAST MEDICAL HISTORY: Past Medical History:  Diagnosis Date  . Anxiety   . Bipolar 1 disorder (HCC)   . Paranoid schizophrenia (HCC)   . PTSD (post-traumatic stress disorder)     MEDICATIONS AT HOME: Prior to Admission medications   Medication Sig Start Date End Date Taking? Authorizing Provider  hydrOXYzine (ATARAX/VISTARIL) 25 MG tablet Take 1 tablet (25 mg total) by mouth every 6 (six) hours as needed for anxiety. 05/18/17  Yes Charm RingsLord, Jamison Y, NP  methocarbamol (ROBAXIN-750) 750 MG tablet Take 1 tablet (750 mg total) by mouth 4 (four) times daily. 11/17/17  Yes Rekisha Welling, Marzella SchleinAngela M, PA-C  MULTIPLE VITAMINS PO Take by mouth.   Yes [provider]  paliperidone (INVEGA) 3 MG 24 hr tablet Take 3 mg by mouth every 30 (thirty) days.   Yes [provider]  polyethylene glycol powder (GLYCOLAX/MIRALAX) powder Take 17 g by mouth 2 (two) times daily as needed. 10/13/16  Yes Hoy RegisterNewlin, Enobong, MD  acetaminophen (TYLENOL) 500 MG tablet Take 1,000 mg by mouth every 6 (six) hours as needed for mild pain, moderate pain, fever or headache.    [provider]  meloxicam (MOBIC) 7.5 MG tablet Take 1 tablet (7.5 mg total) by mouth daily. 11/17/17   Anders SimmondsMcClung, Makinzi Prieur M, PA-C     Objective:  EXAM:   Vitals:   11/17/17 1401  BP: 115/79  Pulse: 90  Resp: 16  Temp: 98.2 F (36.8 C)  TempSrc: Oral  SpO2: 96%  Weight: 200 lb 3.2 oz (90.8 kg)  Height: 5\' 4"  (1.626 m)    General appearance : A&OX3. NAD. Non-toxic-appearing.  Normal gait and ambulation HEENT: Atraumatic and Normocephalic.  PERRLA. Neck: supple, no JVD. No cervical lymphadenopathy. No thyromegaly Chest/Lungs:  Breathing-non-labored, Good air entry bilaterally, breath sounds normal without rales, rhonchi, or wheezing  CVS: S1 S2 regular, no murmurs, gallops, rubs  Back:  Moderate lumbar lordosis.  There is paraspinus spasm on the R>L.  Full S&ROM.  Neg SLR B.  DTR=B.   Extremities: Bilateral Lower Ext shows no edema, both legs are warm to touch with = pulse throughout Neurology:  CN II-XII grossly intact, Non focal.   Psych:  TP linear. J/I WNL. Normal speech. Appropriate eye contact and affect.  Skin:  No Rash  Data Review No results found for: HGBA1C   Assessment & Plan   1. Strain of lumbar region, initial encounter No red flags - DG THORACOLUMABAR SPINE; Future - methocarbamol (ROBAXIN-750) 750 MG tablet; Take 1 tablet (750 mg total) by mouth 4 (four) times daily.  Dispense: 60 tablet; Refill: 2 - meloxicam (MOBIC) 7.5 MG tablet; Take 1 tablet (7.5 mg total) by mouth daily.  Dispense: 30 tablet; Refill: 2 - Ambulatory referral to Spine  Surgery  2. Amenorrhea - POCT urine pregnancy  Patient have been counseled extensively about nutrition and exercise  Return for keep 02/02/2018 appt with Dr Alvis Lemmings.  The patient was given clear instructions to go to ER or return to medical center if symptoms don't improve, worsen or new problems develop. The patient verbalized understanding. The patient was told to call to get lab results if they haven't heard anything in the next week.     Georgian Co, PA-C [facility] [place], [place] [phone]   11/17/2017, 2:29 PM

## 2017-11-17 NOTE — Progress Notes (Signed)
Pt. Stated her back is hurting her. Pt. Stated she wants a note for light duty for work due to her back pain.   Pt. Would like a referral for her back.

## 2017-11-18 ENCOUNTER — Telehealth: Payer: Self-pay | Admitting: Family Medicine

## 2017-11-18 ENCOUNTER — Telehealth: Payer: Self-pay

## 2017-11-18 NOTE — Telephone Encounter (Signed)
Patient called and I informed the patient that the xray shows some mild scoliosis. Also that the PAthinks back exercises and stretching would help. I also told th patient that a referral to the back specialist has been placed.

## 2017-11-18 NOTE — Telephone Encounter (Signed)
-----   Message from Anders SimmondsAngela M McClung, [place] JerseyPA-C sent at 11/18/2017 12:04 PM EDT ----- Your xray shows some mild scoliosis.  I think back exercises and stretching would help.  The referral to the back specialist has been placed. Thanks, Judy CoAngela McClung, PA-C

## 2017-11-18 NOTE — Telephone Encounter (Signed)
CMA attempt to call patient to inform on lab results.  No answer and left a VM for patient to call back.  If patient call back, please inform:  Your xray shows some mild scoliosis.  I think back exercises and stretching would help.  The referral to the back specialist has been placed. Thanks, Georgian CoAngela McClung, PA-C

## 2017-12-02 ENCOUNTER — Emergency Department (HOSPITAL_COMMUNITY)
Admission: EM | Admit: 2017-12-02 | Discharge: 2017-12-02 | Disposition: A | Discharge status: Home / Self Care | Payer: Medicaid Other | Attending: Emergency Medicine | Admitting: Emergency Medicine

## 2017-12-02 ENCOUNTER — Encounter (HOSPITAL_COMMUNITY): Payer: Self-pay | Admitting: Emergency Medicine

## 2017-12-02 DIAGNOSIS — F250 Schizoaffective disorder, bipolar type: Secondary | ICD-10-CM | POA: Insufficient documentation

## 2017-12-02 DIAGNOSIS — M545 Low back pain: Secondary | ICD-10-CM | POA: Diagnosis present

## 2017-12-02 DIAGNOSIS — Z79899 Other long term (current) drug therapy: Secondary | ICD-10-CM | POA: Insufficient documentation

## 2017-12-02 DIAGNOSIS — G8929 Other chronic pain: Secondary | ICD-10-CM | POA: Diagnosis not present

## 2017-12-02 DIAGNOSIS — M549 Dorsalgia, unspecified: Secondary | ICD-10-CM

## 2017-12-02 MED ORDER — LIDOCAINE 5 % EX PTCH
1.0000 | MEDICATED_PATCH | CUTANEOUS | Status: DC
  Administered 2017-12-02: 1 via TRANSDERMAL
  Filled 2017-12-02: qty 1

## 2017-12-02 MED ORDER — LIDOCAINE 5 % EX PTCH: 1.0000 | MEDICATED_PATCH | CUTANEOUS | 0 refills | Status: AC

## 2017-12-02 NOTE — ED Provider Notes (Signed)
[facility]-EMERGENCY DEPT Provider Note   CSN: [id number] Arrival date & time: 12/02/17  4098     History   Chief Complaint Chief Complaint  Patient presents with  . Back Pain    HPI Judy Melton is a 42 y.o. female.  HPI   42 year old female with history of chronic back pain, bipolar, schizophrenia presenting complaining of back pain.  Patient report for the past 4 months she has had progressive worsening low back pain.  Her pain is sharp throbbing achy sometimes radiates to both of her thighs and upwards to her shoulders.  Pain worse with prolonged standing especially when she has to stand for 8 hours straight at work.  Sometimes she has to hunch over to walk due to her pain.  She was seen by her PCP 2 weeks ago for her pain.  An x-ray was performed and patient was told that she has slight scoliosis.  She was giving meloxicam, and methocarbamol for her symptoms.  She also was given referral to have specialist.  She has not heard back from a specialist and she has been using her medication without adequate relief.  Today she left work because of her pain and is here requesting for a note to be off her legs for several weeks in order to get better.  She denies any associated fever, chills, bowel bladder incontinence or saddle anesthesia.  No abdominal pain chest pain or trouble breathing.  No focal numbness or weakness.  Past Medical History:  Diagnosis Date  . Anxiety   . Bipolar 1 disorder (HCC)   . Paranoid schizophrenia (HCC)   . PTSD (post-traumatic stress disorder)     Patient Active Problem List   Diagnosis Date Noted  . GERD (gastroesophageal reflux disease) 10/13/2016  . History of breast cancer 10/13/2016  . Chronic back pain 10/13/2016  . Paranoid schizophrenia (HCC) 10/13/2016  . Bipolar 1 disorder (HCC) 10/13/2016  . Pain in thoracic spine 10/11/2016    History reviewed. No pertinent surgical history.   OB History   None       Home Medications    Prior to Admission medications   Medication Sig Start Date End Date Taking? Authorizing Provider  acetaminophen (TYLENOL) 500 MG tablet Take 1,000 mg by mouth every 6 (six) hours as needed for mild pain, moderate pain, fever or headache.    [provider]  hydrOXYzine (ATARAX/VISTARIL) 25 MG tablet Take 1 tablet (25 mg total) by mouth every 6 (six) hours as needed for anxiety. 05/18/17   Charm Rings, NP  meloxicam (MOBIC) 7.5 MG tablet Take 1 tablet (7.5 mg total) by mouth daily. 11/17/17   Anders Simmonds, PA-C  methocarbamol (ROBAXIN-750) 750 MG tablet Take 1 tablet (750 mg total) by mouth 4 (four) times daily. 11/17/17   Anders Simmonds, PA-C  MULTIPLE VITAMINS PO Take by mouth.    [provider]  paliperidone (INVEGA) 3 MG 24 hr tablet Take 3 mg by mouth every 30 (thirty) days.    [provider]  polyethylene glycol powder (GLYCOLAX/MIRALAX) powder Take 17 g by mouth 2 (two) times daily as needed. 10/13/16   Hoy Register, MD    Family History No family history on file.  Social History Social History   Tobacco Use  . Smoking status: Never Smoker  . Smokeless tobacco: Never Used  Substance Use Topics  . Alcohol use: No  . Drug use: No     Allergies   Patient  has no known allergies.   Review of Systems Review of Systems  All other systems reviewed and are negative.    Physical Exam Updated Vital Signs BP 121/86 (BP Location: Right Arm)   Pulse (!) 103   Temp 98.3 F (36.8 C) (Oral)   Resp 18   Wt 86.2 kg (190 lb)   SpO2 99%   BMI 32.61 kg/m   Physical Exam  Constitutional: She appears well-developed and well-nourished. No distress.  HENT:  Head: Atraumatic.  Eyes: Conjunctivae are normal.  Neck: Neck supple.  Cardiovascular: Normal rate and regular rhythm.  Pulmonary/Chest: Effort normal and breath sounds normal.  Abdominal: Soft. Bowel sounds are normal. She exhibits no distension. There is  no tenderness.  Musculoskeletal: She exhibits tenderness (Tenderness).  Neurological: She is alert.  Skin: No rash noted.  Psychiatric: She has a normal mood and affect.  Nursing note and vitals reviewed.    ED Treatments / Results  Labs (all labs ordered are listed, but only abnormal results are displayed) Labs Reviewed - No data to display  EKG None  Radiology No results found.  Procedures Procedures (including critical care time)  Medications Ordered in ED Medications  lidocaine (LIDODERM) 5 % 1 patch (1 patch Transdermal Patch Applied 12/02/17 1951)     Initial Impression / Assessment and Plan / ED Course  I have reviewed the triage vital signs and the nursing notes.  Pertinent labs & imaging results that were available during my care of the patient were reviewed by me and considered in my medical decision making (see chart for details).    BP 121/86 (BP Location: Right Arm)   Pulse (!) 103   Temp 98.3 F (36.8 C) (Oral)   Resp 18   Wt 89.8 kg (198 lb)   LMP  (Approximate) Comment: last period 8 months ago  SpO2 99%   BMI 33.99 kg/m    Final Clinical Impressions(s) / ED Diagnoses   Final diagnoses:  Other chronic back pain    ED Discharge Orders        Ordered    lidocaine (LIDODERM) 5 %  Every 24 hours     12/02/17 1952     7:54 PM Rest of worsening lower back pain for the past several months.  She is unable to follow-up with a specialist and request help.  No neuro deficit on exam.  She is neurovascular intact.  No red flags.  Able to ambulate.  Will prescribe Lidoderm for pain and will give referral.  Work note provided as requested.   Fayrene Helper, PA-C 12/02/17 1955    Charlynne Pander, MD 12/02/17 [phone]

## 2017-12-02 NOTE — ED Triage Notes (Signed)
Pt has chronic low back pain. [facility] is referring her to a back specialist. Pt has increased pain and is requesting a note limited movement while at work

## 2017-12-03 ENCOUNTER — Encounter: Payer: Self-pay | Admitting: Family Medicine

## 2017-12-16 ENCOUNTER — Encounter

## 2017-12-16 ENCOUNTER — Ambulatory Visit: Payer: Medicaid Other | Attending: Family Medicine | Admitting: Physician Assistant

## 2017-12-16 VITALS — BP 117/77 | HR 80 | Temp 98.3°F | Ht 64.0 in | Wt 199.4 lb

## 2017-12-16 DIAGNOSIS — M545 Low back pain: Secondary | ICD-10-CM | POA: Insufficient documentation

## 2017-12-16 DIAGNOSIS — Z79899 Other long term (current) drug therapy: Secondary | ICD-10-CM | POA: Insufficient documentation

## 2017-12-16 DIAGNOSIS — M5442 Lumbago with sciatica, left side: Secondary | ICD-10-CM

## 2017-12-16 DIAGNOSIS — G8929 Other chronic pain: Secondary | ICD-10-CM | POA: Insufficient documentation

## 2017-12-16 NOTE — Patient Instructions (Signed)
Enjoy your HOLIDAY!

## 2017-12-16 NOTE — Progress Notes (Signed)
Chief Complaint: Back pain  Subjective: This is a 42 year old female with a psychiatric history who is presenting for acute on chronic low back pain.  She was seen last month here and had an x-ray which showed only mild scoliosis.  It was recommended that she take anti-inflammatories and muscle relaxers.  She also was encouraged to stretch more.  She ended up in the emergency department on 12/02/2017 with the same.  She complains of right-sided low back pain in the muscle area.  Sharp, throbbing.  She works in a [employer] account [employer] and does a lot of standing and lifting.  Sometimes she has radiation to the left knee and left shoulder.  She was prescribed a Lidoderm patch and was sent to orthopedics.  Yesterday she saw orthopedics they believe that this is muscle spasms.  She was given tramadol, prednisone and continued on the anti-inflammatories and muscle relaxers.  She is due to return in 3 weeks.  No new complaint today. Same as above.    ROS:  GEN: denies fever or chills, denies change in weight Skin: denies lesions or rashes EXT:+ muscle spasms or swelling; no pain in lower ext, no weakness NEURO: denies numbness or tingling, denies sz, stroke or TIA   Objective:  Vitals:   12/16/17 1344  BP: 117/77  Pulse: 80  Temp: 98.3 F (36.8 C)  TempSrc: Oral  SpO2: 93%  Weight: 199 lb 6.4 oz (90.4 kg)  Height:  (1.626 m)    Physical Exam:  General: in no acute distress. Extremities: no swelling; dec ROM, flexion decreased; sensation good bilat Neuro: Alert, awake, oriented x3, nonfocal.  Pertinent Lab Results:none   Medications: Prior to Admission medications   Medication Sig Start Date End Date Taking? Authorizing Provider  hydrOXYzine (ATARAX/VISTARIL) 25 MG tablet Take 1 tablet (25 mg total) by mouth every 6 (six) hours as needed for anxiety. 05/18/17  Yes Charm Rings, NP  INVEGA SUSTENNA 234 MG/1.5ML SUSY injection Take 234 mg by mouth every 30 (thirty) days. 10/23/17   Yes [provider]  lidocaine (LIDODERM) 5 % Place 1 patch onto the skin daily. Remove & Discard patch within 12 hours or as directed by MD 12/02/17  Yes Fayrene Helper, PA-C  meloxicam (MOBIC) 7.5 MG tablet Take 1 tablet (7.5 mg total) by mouth daily. 11/17/17  Yes Anders Simmonds, PA-C  methocarbamol (ROBAXIN-750) 750 MG tablet Take 1 tablet (750 mg total) by mouth 4 (four) times daily. 11/17/17  Yes Anders Simmonds, PA-C  Multiple Vitamin (MULTIVITAMIN WITH MINERALS) TABS tablet Take 1 tablet by mouth daily.   Yes [provider]  polyethylene glycol powder (GLYCOLAX/MIRALAX) powder Take 17 g by mouth 2 (two) times daily as needed. 10/13/16  Yes Hoy Register, MD    Assessment: Acute on Chronic Back Pin, likely muscle spasms  Plan: Cont Tramadol, muscle relaxers Cont steroid and NSAIDS Reduce duty at work 3 week f/u woth ortho May need PT  Follow up:as scheduled  The patient was given clear instructions to go to ER or return to medical center if symptoms don't improve, worsen or new problems develop. The patient verbalized understanding. The patient was told to call to get lab results if they haven't heard anything in the next week.   This note has been created with Education officer, environmental. Any transcriptional errors are unintentional.   Scot Jun, PA-C 12/16/2017, 1:59 PM

## 2017-12-28 ENCOUNTER — Ambulatory Visit: Payer: Medicaid Other | Attending: Family Medicine | Admitting: Physician Assistant

## 2017-12-28 VITALS — BP 100/68 | HR 85 | Temp 97.8°F | Resp 18 | Ht 64.0 in | Wt 196.0 lb

## 2017-12-28 DIAGNOSIS — F319 Bipolar disorder, unspecified: Secondary | ICD-10-CM | POA: Insufficient documentation

## 2017-12-28 DIAGNOSIS — Z79899 Other long term (current) drug therapy: Secondary | ICD-10-CM | POA: Insufficient documentation

## 2017-12-28 DIAGNOSIS — F200 Paranoid schizophrenia: Secondary | ICD-10-CM | POA: Diagnosis not present

## 2017-12-28 DIAGNOSIS — R05 Cough: Secondary | ICD-10-CM | POA: Diagnosis present

## 2017-12-28 DIAGNOSIS — J069 Acute upper respiratory infection, unspecified: Secondary | ICD-10-CM | POA: Insufficient documentation

## 2017-12-28 DIAGNOSIS — F4310 Post-traumatic stress disorder, unspecified: Secondary | ICD-10-CM | POA: Diagnosis not present

## 2017-12-28 DIAGNOSIS — Z791 Long term (current) use of non-steroidal anti-inflammatories (NSAID): Secondary | ICD-10-CM | POA: Diagnosis not present

## 2017-12-28 MED ORDER — BENZONATATE 100 MG PO CAPS: 200.0000 mg | ORAL_CAPSULE | Freq: Three times a day (TID) | ORAL | 0 refills | Status: AC | PRN

## 2017-12-28 MED FILL — BENZONATATE 100 MG CAP: 100 | 6 days supply | Qty: 40 | Fill #0

## 2017-12-28 NOTE — Patient Instructions (Signed)
mucinex D for sinus congestion, use advil or tylenol if needed for bodyaches or pain/headaches   Upper Respiratory Infection, Adult Most upper respiratory infections (URIs) are caused by a virus. A URI affects the nose, throat, and upper air passages. The most common type of URI is often called "the common cold." Follow these instructions at home:  Take medicines only as told by your doctor.  Gargle warm saltwater or take cough drops to comfort your throat as told by your doctor.  Use a warm mist humidifier or inhale steam from a shower to increase air moisture. This may make it easier to breathe.  Drink enough fluid to keep your pee (urine) clear or pale yellow.  Eat soups and other clear broths.  Have a healthy diet.  Rest as needed.  Go back to work when your fever is gone or your doctor says it is okay. ? You may need to stay home longer to avoid giving your URI to others. ? You can also wear a face mask and wash your hands often to prevent spread of the virus.  Use your inhaler more if you have asthma.  Do not use any tobacco products, including cigarettes, chewing tobacco, or electronic cigarettes. If you need help quitting, ask your doctor. Contact a doctor if:  You are getting worse, not better.  Your symptoms are not helped by medicine.  You have chills.  You are getting more short of breath.  You have brown or red mucus.  You have yellow or brown discharge from your nose.  You have pain in your face, especially when you bend forward.  You have a fever.  You have puffy (swollen) neck glands.  You have pain while swallowing.  You have white areas in the back of your throat. Get help right away if:  You have very bad or constant: ? Headache. ? Ear pain. ? Pain in your forehead, behind your eyes, and over your cheekbones (sinus pain). ? Chest pain.  You have long-lasting (chronic) lung disease and any of the following: ? Wheezing. ? Long-lasting  cough. ? Coughing up blood. ? A change in your usual mucus.  You have a stiff neck.  You have changes in your: ? Vision. ? Hearing. ? Thinking. ? Mood. This information is not intended to replace advice given to you by your health care provider. Make sure you discuss any questions you have with your health care provider. Document Released: 12/29/2007 Document Revised: 03/14/2016 Document Reviewed: 10/17/2013 Elsevier Interactive Patient Education  2018 [employer]

## 2017-12-28 NOTE — Progress Notes (Signed)
Patient ID: Judy Melton, female   DOB: 1976/03/05, 42 y.o.   MRN: [id number]     Judy Melton, is a 42 y.o. female  [id number]  [id number]  DOB - 1976/03/05  Subjective:  Chief Complaint and HPI: Judy Melton is a 42 y.o. female here today Started getting sick about 6-7 days ago. She was around her niece that had a cold and started getting sick a few days later.  She has had cough and sinus congestion.  Some blood-tinged mucus a few days ago.  Overall, she feels she is getting better and is less congested and cough is less today.  No f/c.     ROS:   Constitutional:  No f/c, No night sweats, No unexplained weight loss. EENT:  No vision changes, No blurry vision, No hearing changes. No other mouth, throat, or ear problems.  Respiratory: + cough, No SOB Cardiac: No CP, no palpitations GI:  No abd pain, No N/V/D. GU: No Urinary s/sx Musculoskeletal: No joint pain Neuro: No headache, no dizziness, no motor weakness.  Skin: No rash Endocrine:  No polydipsia. No polyuria.  Psych: Denies SI/HI  No problems updated.  ALLERGIES: No Known Allergies  PAST MEDICAL HISTORY: Past Medical History:  Diagnosis Date  . Anxiety   . Bipolar 1 disorder (HCC)   . Paranoid schizophrenia (HCC)   . PTSD (post-traumatic stress disorder)     MEDICATIONS AT HOME: Prior to Admission medications   Medication Sig Start Date End Date Taking? Authorizing Provider  hydrOXYzine (ATARAX/VISTARIL) 25 MG tablet Take 1 tablet (25 mg total) by mouth every 6 (six) hours as needed for anxiety. 05/18/17  Yes Charm RingsLord, Jamison Y, NP  INVEGA SUSTENNA 234 MG/1.5ML SUSY injection Take 234 mg by mouth every 30 (thirty) days. 10/23/17  Yes [provider]  lidocaine (LIDODERM) 5 % Place 1 patch onto the skin daily. Remove & Discard patch within 12 hours or as directed by MD 12/02/17  Yes Fayrene Helperran, Bowie, PA-C  meloxicam (MOBIC) 7.5 MG tablet Take 1 tablet (7.5 mg total) by mouth daily. 11/17/17  Yes  Anders SimmondsMcClung, Delsa Walder M, PA-C  methocarbamol (ROBAXIN-750) 750 MG tablet Take 1 tablet (750 mg total) by mouth 4 (four) times daily. 11/17/17  Yes Anders SimmondsMcClung, Daryl Beehler M, PA-C  Multiple Vitamin (MULTIVITAMIN WITH MINERALS) TABS tablet Take 1 tablet by mouth daily.   Yes [provider]  polyethylene glycol powder (GLYCOLAX/MIRALAX) powder Take 17 g by mouth 2 (two) times daily as needed. 10/13/16  Yes Hoy RegisterNewlin, Enobong, MD  benzonatate (TESSALON) 100 MG capsule Take 2 capsules (200 mg total) by mouth 3 (three) times daily as needed for cough. 12/28/17   Anders SimmondsMcClung, Eudell Julian M, PA-C     Objective:  EXAM:   Vitals:   12/28/17 1451  BP: 100/68  Pulse: 85  Resp: 18  Temp: 97.8 F (36.6 C)  TempSrc: Oral  SpO2: 99%  Weight: 196 lb (88.9 kg)  Height: 5\' 4"  (1.626 m)    General appearance : A&OX3. NAD. Non-toxic-appearing HEENT: Atraumatic and Normocephalic.  PERRLA. EOM intact.  TM full B. No infection. Mouth-MMM, post pharynx WNL w/o erythema, + PND.  +turbinates congested Neck: supple, no JVD. No cervical lymphadenopathy. No thyromegaly Chest/Lungs:  Breathing-non-labored, Good air entry bilaterally, breath sounds normal without rales, rhonchi, or wheezing  CVS: S1 S2 regular, no murmurs, gallops, rubs  Extremities: Bilateral Lower Ext shows no edema, both legs are warm to touch with = pulse throughout Neurology:  CN II-XII grossly intact, Non focal.  Psych:  TP linear. J/I WNL. Normal speech. Appropriate eye contact and affect.  Skin:  No Rash  Data Review No results found for: HGBA1C   Assessment & Plan   1. Viral upper respiratory tract infection Improving slightly-defer antibiotics bc on day 6 and starting to improve.  Fluids, rest, respiratory care.   - benzonatate (TESSALON) 100 MG capsule; Take 2 capsules (200 mg total) by mouth 3 (three) times daily as needed for cough.  Dispense: 40 capsule; Refill: 0  Patient have been counseled extensively about nutrition and  exercise  Return for keep 02/02/2018 appt with Dr Alvis Lemmings.  The patient was given clear instructions to go to ER or return to medical center if symptoms don't improve, worsen or new problems develop. The patient verbalized understanding. The patient was told to call to get lab results if they haven't heard anything in the next week.     Georgian Co, PA-C [facility] [place], [place] [phone]   12/28/2017, 2:59 PM

## 2018-01-18 ENCOUNTER — Ambulatory Visit: Payer: Medicaid Other | Admitting: Physical Therapy

## 2018-01-19 ENCOUNTER — Encounter: Payer: Self-pay | Admitting: Physical Therapy

## 2018-01-19 ENCOUNTER — Ambulatory Visit: Payer: Medicaid Other | Attending: Orthopedic Surgery | Admitting: Physical Therapy

## 2018-01-19 ENCOUNTER — Other Ambulatory Visit: Payer: Self-pay

## 2018-01-19 DIAGNOSIS — M6281 Muscle weakness (generalized): Secondary | ICD-10-CM | POA: Diagnosis present

## 2018-01-19 DIAGNOSIS — M25551 Pain in right hip: Secondary | ICD-10-CM | POA: Diagnosis present

## 2018-01-19 DIAGNOSIS — R293 Abnormal posture: Secondary | ICD-10-CM | POA: Insufficient documentation

## 2018-01-19 NOTE — Therapy (Signed)
[facility] [address] [place], [place], [address] Phone: [phone]   Fax:  [phone]  Physical Therapy Evaluation  Patient Details  Name: Judy Melton MRN: [id number] Date of Birth: 1976/04/09 Referring Provider: Milly Jakob MD   Encounter Date: 01/19/2018  PT End of Session - 01/19/18 1052    Visit Number  1    Number of Visits  12    Authorization Type  MCD (Resubmit at 4th visit)    PT Start Time  1100    PT Stop Time  1147    PT Time Calculation (min)  47 min    Activity Tolerance  Patient tolerated treatment well    Behavior During Therapy  [facility] for tasks assessed/performed       Past Medical History:  Diagnosis Date  . Anxiety   . Bipolar 1 disorder (HCC)   . Depression    pt reported  . Paranoid schizophrenia (HCC)   . PTSD (post-traumatic stress disorder)     History reviewed. No pertinent surgical history.  There were no vitals filed for this visit.   Subjective Assessment - 01/19/18 1105    Subjective  pt is a 42 y.o F with CC of R hp pain that has been going on for the last couple of months since Decmeber that started with non-traumatic onset. reports as sharpr burning in the outside of the outside of the hip. pt rpeorts having low back pain that started about the same time the hip did that refers to the back and to the knee. she takes muscle relaxers with some improvement. pt denies any red flags, and reports no hx of previous hip problems. since onset the pain seems to be the same with no improvement.     Limitations  Standing;Lifting;Sitting;Walking    How long can you sit comfortably?  30- 40 min    How long can you stand comfortably?  30-40 min    How long can you walk comfortably?  30 -40 min     Diagnostic tests  x-ray on the back     Patient Stated Goals  to improve standing tolerance, decrease pain in the hip,     Currently in Pain?  Yes    Pain Score  2  at worst 7-8/10    Pain  Location  Hip    Pain Orientation  Right    Pain Descriptors / Indicators  Burning    Pain Type  Chronic pain    Pain Onset  More than a month ago    Pain Frequency  Constant    Aggravating Factors   prolonged sitting, standing, walking, direct pressure/ laying on the side     Pain Relieving Factors  praying, medication     Effect of Pain on Daily Activities  limited endurance    Multiple Pain Sites  Yes    Pain Score  4    Pain Location  Back    Pain Orientation  Lower    Pain Descriptors / Indicators  Constant;Aching    Pain Radiating Towards  to the shoulder blade and reports referral to the knee with inttermittent referral to the ankle    Pain Onset  More than a month ago    Pain Frequency  Constant    Aggravating Factors   prolonged sitting, standing, walking, direct pressure/ laying on the side     Pain Relieving Factors  medication, biofreeze, liodcane patches    Effect of Pain  on Daily Activities  limited endurance         [facility] - 01/19/18 1048      Assessment   Medical Diagnosis  R Trochanteric Bursitis    Referring Provider  Milly JakobJohn Lee Graves MD    Onset Date/Surgical Date  -- December 2018    Hand Dominance  Right    Next MD Visit  3 weeks    Prior Therapy  yes      Precautions   Precaution Comments  no standing for long periods of time, light duties at work      Restrictions   Weight Bearing Restrictions  No      Balance Screen   Has the patient fallen in the past 6 months  No    Has the patient had a decrease in activity level because of a fear of falling?   No    Is the patient reluctant to leave their home because of a fear of falling?   No      Home Environment   Living Environment  Private residence    Living Arrangements  Alone    Type of Home  Apartment    Home Access  Stairs to enter    Entrance Stairs-Number of Steps  18    Entrance Stairs-Rails  Left ascending    Home Layout  One level      Prior Function   Level of Independence   Independent with basic ADLs    Vocation  Part time employment [employer] employee    Vocation Requirements  prolonged standing but has recently been put on light duty      Cognition   Overall Cognitive Status  Within Functional Limits for tasks assessed      Observation/Other Assessments   Lower Extremity Functional Scale   38/80      Posture/Postural Control   Posture/Postural Control  Postural limitations    Postural Limitations  Rounded Shoulders;Forward head;Decreased thoracic kyphosis      ROM / Strength   AROM / PROM / Strength  AROM;Strength;PROM      AROM   AROM Assessment Site  Hip;Lumbar    Right/Left Hip  Right;Left    Right Hip Extension  5    Right Hip Flexion  100    Right Hip External Rotation   11    Right Hip Internal Rotation   10    Left Hip Extension  5    Left Hip Flexion  103    Left Hip External Rotation   16    Left Hip Internal Rotation   23    Lumbar Flexion  60 ERP    Lumbar Extension  20    Lumbar - Right Side Bend  22    Lumbar - Left Side Bend  18 ERP noted on the R side      PROM   PROM Assessment Site  Hip    Right/Left Hip  Right    Right Hip External Rotation   22    Right Hip Internal Rotation   23      Strength   Overall Strength Comments  limited effort given during assessment    Strength Assessment Site  Hip    Right/Left Hip  Right;Left    Right Hip Flexion  4-/5    Right Hip Extension  3+/5    Right Hip ABduction  4-/5    Right Hip ADduction  3-/5    Left Hip Flexion  4-/5    Left Hip Extension  3+/5    Left Hip ABduction  3+/5    Left Hip ADduction  3-/5      Right Hip   Right Hip Flexion  110      Palpation   Palpation comment  TTP along the piriformis, glute med/ minimius and greater trochanter.  spasm noted along R thoracolumbar paraspinals      Special Tests    Special Tests  Hip Special Tests;Sacrolliac Tests    Sacroiliac Tests   Gaenslen's Test thigh thrust test    Hip Special Tests   Hip Scouring;Ely's  Test;Ober's Test;Thomas Test      Sacral thrust    Findings  Positive    Side  Right    Comments  reproduced symptoms of pain       Gaenslen's test   Findings  Negative      Thomas Test    Findings  Not tested      Ober's Test   Findings  Positive    Side  Right      Ely's Test   Findings  Negative      Hip Scouring   Findings  Negative      Ambulation/Gait   Ambulation/Gait  Yes    Gait Pattern  Step-through pattern;Trendelenburg                Objective measurements completed on examination: See above findings.              PT Education - 01/19/18 1054    Education Details  evaluation findings, POC, goals, HEP with proper form/ rationale.     Person(s) Educated  Patient    Methods  Explanation;Verbal cues;Demonstration;Handout    Comprehension  Verbalized understanding;Verbal cues required;Returned demonstration       PT Short Term Goals - 01/19/18 1211      PT SHORT TERM GOAL #1   Title  pt to be I with inital HEp     Baseline  no previous HEp    Time  3    Period  Weeks    Status  New    Target Date  02/09/18      PT SHORT TERM GOAL #2   Title  pt to verbalize/ demo proper posture in sitting/ standing as well as lifting mechanics to prevent/ reduce hip and low back pain     Baseline  no knowledge of proper posture    Time  3    Period  Weeks    Status  New    Target Date  02/09/18        PT Long Term Goals - 01/19/18 1212      PT LONG TERM GOAL #1   Title  increase r hip strength to >/= 4/5 in all planes to promote hip stability and reduce hip/ low back  pain to </= 2/10 pain     Baseline  r hip flexion 4-/5, extension 3+/5, abduction 4-/5, adduction 3-/5    Time  6    Period  Weeks    Status  New    Target Date  03/02/18      PT LONG TERM GOAL #2   Title  pt to be able to sit/ stand and walk >=/60 min with </= 1/10 pain to assist with work related activities     Baseline  30-40 min reporting pain in hip/ low back to 6/10     Time  6  Period  Weeks    Status  New    Target Date  02/09/18      PT LONG TERM GOAL #3   Title  increase LEFS score to >/= 50/80 to demonstrate improvement in function     Baseline  38/80 initial score    Time  6    Period  Weeks    Status  New    Target Date  03/02/18      PT LONG TERM GOAL #4   Title  pt to be I with all HEP given as of last visit to maintain and progress current level of function    Baseline  no previous HEP    Time  6    Period  Weeks    Status  New    Target Date  03/02/18             Plan - 01/19/18 1204    Clinical Impression Statement  pt present to OPPT with CC of R lateral hp pain and low back that has been present since december with non-trauamtic onset. mild hip mobility noted secondary to muscle weakness inthe R compared bil. special testing and pt symptomology is consistent with dx of trochanteric bursitis on the R. she would benefit from physical therapy to decrease hip pain, improve strength, promote standing/ walking endurance and maximize function by addressing the deficits listed.     History and Personal Factors relevant to plan of care:  pt lives alone, hx of depression    Clinical Presentation  Evolving    Clinical Presentation due to:  decreased hip strength, hip pain that is constant with back pain that radiates to the shoulder blade/ knee, limited hip mobility, abnormal posture    Clinical Decision Making  Moderate    Rehab Potential  Good    PT Frequency  2x / week    PT Duration  6 weeks    PT Treatment/Interventions  ADLs/Self Care Home Management;Cryotherapy;Physicist, medical;Therapeutic activities;Iontophoresis 4mg /ml Dexamethasone;Patient/family education;Passive range of motion;Gait training;Manual techniques;Taping;Dry needling;Therapeutic exercise    PT Next Visit Plan  review/ update HEP, STW along glute med, core strength, hip strength, update HEP for ITB stretch    PT Home  Exercise Plan  lower trunk rotation, clamshell, pelvic tilt, adductor ball squeeze    Consulted and Agree with Plan of Care  Patient       Patient will benefit from skilled therapeutic intervention in order to improve the following deficits and impairments:  Decreased activity tolerance, Decreased endurance, Improper body mechanics, Postural dysfunction, Pain, Obesity, Increased fascial restricitons, Decreased strength, Increased muscle spasms  Visit Diagnosis: Pain in right hip  Muscle weakness (generalized)  Abnormal posture     Problem List Patient Active Problem List   Diagnosis Date Noted  . GERD (gastroesophageal reflux disease) 10/13/2016  . History of breast cancer 10/13/2016  . Chronic back pain 10/13/2016  . Paranoid schizophrenia (HCC) 10/13/2016  . Bipolar 1 disorder (HCC) 10/13/2016  . Pain in thoracic spine 10/11/2016   Lulu Riding PT, DPT, LAT, ATC  01/19/18  12:22 PM      [facility] [address] [place], [place], [address] Phone: [phone]   Fax:  [phone]  Name: Judy Melton MRN: [id number] Date of Birth: May 05, 1976

## 2018-01-20 ENCOUNTER — Encounter (HOSPITAL_COMMUNITY): Payer: Self-pay

## 2018-01-20 ENCOUNTER — Other Ambulatory Visit: Payer: Self-pay

## 2018-01-20 ENCOUNTER — Emergency Department (HOSPITAL_COMMUNITY)
Admission: EM | Admit: 2018-01-20 | Discharge: 2018-01-20 | Disposition: A | Discharge status: Home / Self Care | Payer: Medicaid Other | Attending: Emergency Medicine | Admitting: Emergency Medicine

## 2018-01-20 DIAGNOSIS — Z79899 Other long term (current) drug therapy: Secondary | ICD-10-CM | POA: Diagnosis not present

## 2018-01-20 DIAGNOSIS — R55 Syncope and collapse: Secondary | ICD-10-CM | POA: Diagnosis not present

## 2018-01-20 DIAGNOSIS — R42 Dizziness and giddiness: Secondary | ICD-10-CM | POA: Diagnosis present

## 2018-01-20 LAB — URINALYSIS, ROUTINE W REFLEX MICROSCOPIC
Bacteria, UA: NONE SEEN
Bilirubin Urine: NEGATIVE
GLUCOSE, UA: NEGATIVE mg/dL
HGB URINE DIPSTICK: NEGATIVE
Ketones, ur: NEGATIVE mg/dL
NITRITE: NEGATIVE
Protein, ur: NEGATIVE mg/dL
SPECIFIC GRAVITY, URINE: 1.015 (ref 1.005–1.030)
pH: 5 (ref 5.0–8.0)

## 2018-01-20 LAB — I-STAT BETA HCG BLOOD, ED (MC, WL, AP ONLY): I-stat hCG, quantitative: <5 m[IU]/mL (ref <5)

## 2018-01-20 LAB — CBC
HCT: 37.5 % (ref 36.0–46.0)
HEMOGLOBIN: 12.2 g/dL (ref 12.0–15.0)
MCH: 28.6 pg (ref 26.0–34.0)
MCHC: 32.5 g/dL (ref 30.0–36.0)
MCV: 87.8 fL (ref 78.0–100.0)
Platelets: 332 10*3/uL (ref 150–400)
RBC: 4.27 MIL/uL (ref 3.87–5.11)
RDW: 16.1 % — AB (ref 11.5–15.5)
WBC: 8.4 10*3/uL (ref 4.0–10.5)

## 2018-01-20 LAB — BASIC METABOLIC PANEL
ANION GAP: 6 (ref 5–15)
BUN: 9 mg/dL (ref 6–20)
CALCIUM: 9.7 mg/dL (ref 8.9–10.3)
CO2: 29 mmol/L (ref 22–32)
Chloride: 107 mmol/L (ref 98–111)
Creatinine, Ser: 0.95 mg/dL (ref 0.44–1.00)
GFR calc Af Amer: >60 mL/min (ref >60)
GLUCOSE: 104 mg/dL — AB (ref 70–99)
Potassium: 3.4 mmol/L — ABNORMAL LOW (ref 3.5–5.1)
SODIUM: 142 mmol/L (ref 135–145)

## 2018-01-20 LAB — CBG MONITORING, ED: Glucose-Capillary: 90 mg/dL (ref 70–99)

## 2018-01-20 NOTE — ED Triage Notes (Signed)
Patient c/o near syncopal episode this afternoon while at work. patient reports issues with hypotension in the past.

## 2018-01-20 NOTE — ED Provider Notes (Signed)
[facility]-EMERGENCY DEPT Provider Note   CSN: [id number] Arrival date & time: 01/20/18  1719     History   Chief Complaint Chief Complaint  Patient presents with  . Near Syncope    HPI Judy Melton is a 42 y.o. female.  Patient is a 41 year old female with a history of anxiety disorder and bipolar disorder who presents with an episode of dizziness.  She states that she works in a [employer] and was working today when she felt lightheaded.  She sat down and ate something and has felt better since that time.  She states she stands for long periods of time during the day.  She states that she had not really eaten or drink anything prior to this episode.  She denies any ongoing dizziness.  No headache.  She states she recently had a URI but her symptoms have been getting better.  She denies any recent fevers.  No history of similar symptoms in the past.     Past Medical History:  Diagnosis Date  . Anxiety   . Bipolar 1 disorder (HCC)   . Depression    pt reported  . Paranoid schizophrenia (HCC)   . PTSD (post-traumatic stress disorder)     Patient Active Problem List   Diagnosis Date Noted  . GERD (gastroesophageal reflux disease) 10/13/2016  . History of breast cancer 10/13/2016  . Chronic back pain 10/13/2016  . Paranoid schizophrenia (HCC) 10/13/2016  . Bipolar 1 disorder (HCC) 10/13/2016  . Pain in thoracic spine 10/11/2016    History reviewed. No pertinent surgical history.   OB History   None      Home Medications    Prior to Admission medications   Medication Sig Start Date End Date Taking? Authorizing Provider  diclofenac (VOLTAREN) 75 MG EC tablet Take 75 mg by mouth 2 (two) times daily. 01/12/18  Yes [provider]  hydrOXYzine (ATARAX/VISTARIL) 25 MG tablet Take 1 tablet (25 mg total) by mouth every 6 (six) hours as needed for anxiety. 05/18/17  Yes Charm Rings, NP  INVEGA SUSTENNA 234 MG/1.5ML SUSY injection  Take 234 mg by mouth every 30 (thirty) days. 10/23/17  Yes [provider]  lidocaine (LIDODERM) 5 % Place 1 patch onto the skin daily. Remove & Discard patch within 12 hours or as directed by MD 12/02/17  Yes Fayrene Helper, PA-C  methocarbamol (ROBAXIN-750) 750 MG tablet Take 1 tablet (750 mg total) by mouth 4 (four) times daily. Patient taking differently: Take 750 mg by mouth 2 (two) times daily.  11/17/17  Yes Anders Simmonds, PA-C  Multiple Vitamin (MULTIVITAMIN WITH MINERALS) TABS tablet Take 1 tablet by mouth daily.   Yes [provider]  polyethylene glycol powder (GLYCOLAX/MIRALAX) powder Take 17 g by mouth 2 (two) times daily as needed. 10/13/16  Yes Hoy Register, MD  traMADol (ULTRAM) 50 MG tablet Take 50 mg by mouth every 8 (eight) hours as needed for pain. 12/15/17  Yes [provider]  benzonatate (TESSALON) 100 MG capsule Take 2 capsules (200 mg total) by mouth 3 (three) times daily as needed for cough. Patient not taking: Reported on 01/19/2018 12/28/17   Anders Simmonds, PA-C  meloxicam (MOBIC) 7.5 MG tablet Take 1 tablet (7.5 mg total) by mouth daily. Patient not taking: Reported on 01/19/2018 11/17/17   Anders Simmonds, PA-C    Family History Family History  Problem Relation Age of Onset  . Cancer Mother   . Diabetes  Father     Social History Social History   Tobacco Use  . Smoking status: Never Smoker  . Smokeless tobacco: Never Used  Substance Use Topics  . Alcohol use: No  . Drug use: No     Allergies   Patient has no known allergies.   Review of Systems Review of Systems  Constitutional: Negative for chills, diaphoresis, fatigue and fever.  HENT: Negative for congestion, rhinorrhea and sneezing.   Eyes: Negative.   Respiratory: Negative for cough, chest tightness and shortness of breath.   Cardiovascular: Negative for chest pain and leg swelling.  Gastrointestinal: Negative for abdominal pain, blood in stool, diarrhea, nausea  and vomiting.  Genitourinary: Negative for difficulty urinating, flank pain, frequency and hematuria.  Musculoskeletal: Negative for arthralgias and back pain.  Skin: Negative for rash.  Neurological: Positive for light-headedness. Negative for dizziness, speech difficulty, weakness, numbness and headaches.     Physical Exam Updated Vital Signs BP (!) 132/99 (BP Location: Right Arm)   Pulse 75   Temp 98.1 F (36.7 C) (Oral)   Resp 17   Ht 5\' 4"  (1.626 m)   Wt 89.1 kg (196 lb 8 oz)   SpO2 100%   BMI 33.73 kg/m   Physical Exam  Constitutional: She is oriented to person, place, and time. She appears well-developed and well-nourished.  HENT:  Head: Normocephalic and atraumatic.  Eyes: Pupils are equal, round, and reactive to light.  Neck: Normal range of motion. Neck supple.  Cardiovascular: Normal rate, regular rhythm and normal heart sounds.  Pulmonary/Chest: Effort normal and breath sounds normal. No respiratory distress. She has no wheezes. She has no rales. She exhibits no tenderness.  Abdominal: Soft. Bowel sounds are normal. There is no tenderness. There is no rebound and no guarding.  Musculoskeletal: Normal range of motion. She exhibits no edema.  Lymphadenopathy:    She has no cervical adenopathy.  Neurological: She is alert and oriented to person, place, and time.  Motor 5/5 all extremities Sensation grossly intact to LT all extremities Finger to Nose intact, no pronator drift CN II-XII grossly intact Gait normal   Skin: Skin is warm and dry. No rash noted.  Psychiatric: She has a normal mood and affect.     ED Treatments / Results  Labs (all labs ordered are listed, but only abnormal results are displayed) Labs Reviewed  BASIC METABOLIC PANEL - Abnormal; Notable for the following components:      Result Value   Potassium 3.4 (*)    Glucose, Bld 104 (*)    All other components within normal limits  CBC - Abnormal; Notable for the following components:    RDW 16.1 (*)    All other components within normal limits  URINALYSIS, ROUTINE W REFLEX MICROSCOPIC - Abnormal; Notable for the following components:   Leukocytes, UA TRACE (*)    All other components within normal limits  CBG MONITORING, ED  I-STAT BETA HCG BLOOD, ED ([facility], [facility], [facility] ONLY)    EKG EKG Interpretation  Date/Time:  Friday January 20 2018 18:26:12 EDT Ventricular Rate:  88 PR Interval:    QRS Duration: 93 QT Interval:  352 QTC Calculation: 426 R Axis:   54 Text Interpretation:  Sinus rhythm Low voltage, precordial leads No old tracing to compare Confirmed by Rolan Bucco [phone]) on 01/20/2018 7:41:57 PM   Radiology No results found.  Procedures Procedures (including critical care time)  Medications Ordered in ED Medications - No data to display   Initial  Impression / Assessment and Plan / ED Course  I have reviewed the triage vital signs and the nursing notes.  Pertinent labs & imaging results that were available during my care of the patient were reviewed by me and considered in my medical decision making (see chart for details).     Patient is a 42 year old female who presents after an episode of lightheadedness.  She states she has not eaten or drink anything all day.  She did feel better after sitting down for a minute and eating.  She has had no further symptoms.  No neurologic deficits.  No chest pain or suggestions of arrhythmias.  Her labs are reviewed and are non-concerning.  If her EKG shows a sinus rhythm with no evidence of arrhythmia.  She currently is asymptomatic.  She was discharged home in good condition.  She was encouraged to drink fluids and follow-up with her PCP.  Return precautions were given.  Final Clinical Impressions(s) / ED Diagnoses   Final diagnoses:  Near syncope    ED Discharge Orders    None       Rolan BuccoBelfi, Sarinity Dicicco, MD 01/20/18 2348

## 2018-01-20 NOTE — ED Notes (Signed)
Pt given apple juice and saltines 

## 2018-02-02 ENCOUNTER — Ambulatory Visit: Payer: Medicaid Other | Attending: Family Medicine | Admitting: Family Medicine

## 2018-02-02 ENCOUNTER — Ambulatory Visit: Payer: Medicaid Other | Attending: Orthopedic Surgery | Admitting: Physical Therapy

## 2018-02-02 ENCOUNTER — Encounter: Payer: Self-pay | Admitting: Family Medicine

## 2018-02-02 VITALS — BP 104/69 | HR 81 | Temp 97.5°F | Ht 64.0 in | Wt 195.0 lb

## 2018-02-02 DIAGNOSIS — F4310 Post-traumatic stress disorder, unspecified: Secondary | ICD-10-CM | POA: Diagnosis not present

## 2018-02-02 DIAGNOSIS — F200 Paranoid schizophrenia: Secondary | ICD-10-CM | POA: Diagnosis not present

## 2018-02-02 DIAGNOSIS — Z79899 Other long term (current) drug therapy: Secondary | ICD-10-CM | POA: Diagnosis not present

## 2018-02-02 DIAGNOSIS — M549 Dorsalgia, unspecified: Secondary | ICD-10-CM

## 2018-02-02 DIAGNOSIS — G8929 Other chronic pain: Secondary | ICD-10-CM | POA: Diagnosis not present

## 2018-02-02 DIAGNOSIS — N912 Amenorrhea, unspecified: Secondary | ICD-10-CM | POA: Diagnosis present

## 2018-02-02 DIAGNOSIS — R293 Abnormal posture: Secondary | ICD-10-CM | POA: Diagnosis present

## 2018-02-02 DIAGNOSIS — M545 Low back pain: Secondary | ICD-10-CM | POA: Diagnosis not present

## 2018-02-02 DIAGNOSIS — M25551 Pain in right hip: Secondary | ICD-10-CM | POA: Diagnosis present

## 2018-02-02 DIAGNOSIS — M6281 Muscle weakness (generalized): Secondary | ICD-10-CM | POA: Diagnosis present

## 2018-02-02 NOTE — Therapy (Addendum)
[facility] [address] [place], [place], [address] Phone: [phone]   Fax:  [phone]  Physical Therapy Treatment  Patient Details  Name: Judy Melton MRN: [id number] Date of Birth: [birth year] Referring Provider: Milly Jakob MD   Encounter Date: 02/02/2018  PT End of Session - 02/02/18 0850    Visit Number  2    Number of Visits  12    Authorization Type  MCD (Resubmit at 4th visit)    PT Start Time  0800    PT Stop Time  0853    PT Time Calculation (min)  53 min    Activity Tolerance  Patient tolerated treatment well    Behavior During Therapy  [facility] for tasks assessed/performed       Past Medical History:  Diagnosis Date  . Anxiety   . Bipolar 1 disorder (HCC)   . Depression    pt reported  . Paranoid schizophrenia (HCC)   . PTSD (post-traumatic stress disorder)     No past surgical history on file.  There were no vitals filed for this visit.  Subjective Assessment - 02/02/18 0801    Subjective  "I've been a little hard headed with my exercises. I have only done them twice."    Currently in Pain?  Yes    Pain Score  5     Pain Location  Back    Pain Orientation  Lower    Pain Descriptors / Indicators  Aching    Pain Type  Acute pain    Pain Onset  More than a month ago    Pain Frequency  Intermittent    Pain Relieving Factors  medication                       [facility] Adult PT Treatment/Exercise - 02/02/18 0001      Lumbar Exercises: Stretches   Passive Hamstring Stretch  1 rep;30 seconds;Right;Left PNF hold relax    ITB Stretch  Right;Left;1 rep;30 seconds painful on R      Lumbar Exercises: Supine   Pelvic Tilt  10 reps;5 seconds    Clam  10 reps    Bent Knee Raise  10 reps x 2 sets    Other Supine Lumbar Exercises  lower trunk rotations 1 x 10 with 5 sec hold at end range      Lumbar Exercises: Sidelying   Clam  10 reps;Right cues to keep hips rolled forward      Knee/Hip Exercises: Supine   Hip Adduction Isometric  1 set;10 reps 5 sec hold with ball squeeze    Bridges  AROM;Strengthening;1 set;10 reps      Cryotherapy   Number Minutes Cryotherapy  10 Minutes    Cryotherapy Location  Hip R    Type of Cryotherapy  Ice pack      Manual Therapy   Manual Therapy  Soft tissue mobilization    Soft tissue mobilization  MTPr of R glute med, muscle roller R ITB             PT Education - 02/02/18 0849    Education Details  HEP, MTPr, muscle rolling STM    Person(s) Educated  Patient    Methods  Explanation;Handout    Comprehension  Verbalized understanding       PT Short Term Goals - 01/19/18 1211      PT SHORT TERM GOAL #1   Title  pt to be I  with inital HEp     Baseline  no previous HEp    Time  3    Period  Weeks    Status  New    Target Date  02/09/18      PT SHORT TERM GOAL #2   Title  pt to verbalize/ demo proper posture in sitting/ standing as well as lifting mechanics to prevent/ reduce hip and low back pain     Baseline  no knowledge of proper posture    Time  3    Period  Weeks    Status  New    Target Date  02/09/18        PT Long Term Goals - 01/19/18 1212      PT LONG TERM GOAL #1   Title  increase r hip strength to >/= 4/5 in all planes to promote hip stability and reduce hip/ low back  pain to </= 2/10 pain     Baseline  r hip flexion 4-/5, extension 3+/5, abduction 4-/5, adduction 3-/5    Time  6    Period  Weeks    Status  New    Target Date  03/02/18      PT LONG TERM GOAL #2   Title  pt to be able to sit/ stand and walk >=/60 min with </= 1/10 pain to assist with work related activities     Baseline  30-40 min reporting pain in hip/ low back to 6/10    Time  6    Period  Weeks    Status  New    Target Date  02/09/18      PT LONG TERM GOAL #3   Title  increase LEFS score to >/= 50/80 to demonstrate improvement in function     Baseline  38/80 initial score    Time  6    Period  Weeks    Status   New    Target Date  03/02/18      PT LONG TERM GOAL #4   Title  pt to be I with all HEP given as of last visit to maintain and progress current level of function    Baseline  no previous HEP    Time  6    Period  Weeks    Status  New    Target Date  03/02/18            Plan - 02/02/18 0850    Clinical Impression Statement  Pt reports she has not beeig keeping up with all of her exercises. Treatment today focused on stretching of hamstrings and ITBs. Also focused on hip and core strengthening and manual therapy of R ITB and R glute medius. pt reported initially reported pain with muscle rolling of ITB, but stated it was better at end of treatment. Pt reported decreased pain at end of session (3/10 at end of session as compared to 5/10 at beginning of session).     PT Treatment/Interventions  ADLs/Self Care Home Management;Cryotherapy;Physicist, medicallectrical Stimulation;Moist Heat;Ultrasound;Balance training;Therapeutic activities;Iontophoresis 4mg /ml Dexamethasone;Patient/family education;Passive range of motion;Gait training;Manual techniques;Taping;Dry needling;Therapeutic exercise    PT Next Visit Plan  review/ update HEP, STW along glute med and ITB, core strength, hip strength, update HEP for ITB stretch    PT Home Exercise Plan  lower trunk rotation, sidelying clamshell, pelvic tilt, adductor ball squeeze    Consulted and Agree with Plan of Care  Patient       Patient will benefit from skilled therapeutic  intervention in order to improve the following deficits and impairments:  Decreased activity tolerance, Decreased endurance, Improper body mechanics, Postural dysfunction, Pain, Obesity, Increased fascial restricitons, Decreased strength, Increased muscle spasms  Visit Diagnosis: Pain in right hip  Muscle weakness (generalized)  Abnormal posture     Problem List Patient Active Problem List   Diagnosis Date Noted  . GERD (gastroesophageal reflux disease) 10/13/2016  . History of  breast cancer 10/13/2016  . Chronic back pain 10/13/2016  . Paranoid schizophrenia (HCC) 10/13/2016  . Bipolar 1 disorder (HCC) 10/13/2016  . Pain in thoracic spine 10/11/2016   Laurelyn Sickle, SPT 02/02/18 9:01 AM   Jannette Spanner, PTA 02/02/18 9:09 AM Phone: [phone] Fax: [phone]  [facility] [address] [place], [place], [address] Phone: [phone]   Fax:  [phone]  Name: SULA FETTERLY MRN: [id number] Date of Birth: 1976-04-01

## 2018-02-02 NOTE — Patient Instructions (Signed)
Abduction: Clam (Eccentric) - Side-Lying    Lie on side with knees bent. Lift top knee, keeping feet together. Keep trunk steady. Slowly lower for 3-5 seconds. __10_ reps per set, __2_ sets per day, _7__ days per week.    

## 2018-02-02 NOTE — Progress Notes (Signed)
Subjective:  Patient ID: Judy Melton, female    DOB: 29-Apr-1976  Age: 42 y.o. MRN: [id number]  CC: Gastroesophageal Reflux   HPI Judy Melton is a 42 year old female with a history of schizophrenia, bipolar disorder, chronic low back pain who comes into the clinic today for a follow-up visit. She complains of amenorrhea for the last 10 months and has not been on any form of contraception and denies sexual activity.  Remote history of Depo-Provera use 7 years ago.  Denies abdominal cramping, vaginal discharge, urinary symptoms. Schizoaffective disorder is managed by psychiatry and she has been on Invega since 05/2017 and undergoes counseling as well. Currently sees a back specialist for her low back pain which is rated at the 4-5/10 at this time and is doing well on Robaxin and diclofenac and undergoing physical therapy.  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  Past Medical History:  Diagnosis Date  . Anxiety   . Bipolar 1 disorder (HCC)   . Depression    pt reported  . Paranoid schizophrenia (HCC)   . PTSD (post-traumatic stress disorder)     History reviewed. No pertinent surgical history.  No Known Allergies   Outpatient Medications Prior to Visit  Medication Sig Dispense Refill  . diclofenac (VOLTAREN) 75 MG EC tablet Take 75 mg by mouth 2 (two) times daily.  0  . hydrOXYzine (ATARAX/VISTARIL) 25 MG tablet Take 1 tablet (25 mg total) by mouth every 6 (six) hours as needed for anxiety. 30 tablet 0  . INVEGA SUSTENNA 234 MG/1.5ML SUSY injection Take 234 mg by mouth every 30 (thirty) days.  2  . lidocaine (LIDODERM) 5 % Place 1 patch onto the skin daily. Remove & Discard patch within 12 hours or as directed by MD 30 patch 0  . methocarbamol (ROBAXIN-750) 750 MG tablet Take 1 tablet (750 mg total) by mouth 4 (four) times daily. (Patient taking differently: Take 750 mg by mouth 2 (two) times daily. ) 60 tablet 2  . Multiple  Vitamin (MULTIVITAMIN WITH MINERALS) TABS tablet Take 1 tablet by mouth daily.    . polyethylene glycol powder (GLYCOLAX/MIRALAX) powder Take 17 g by mouth 2 (two) times daily as needed. 3350 g 1  . traMADol (ULTRAM) 50 MG tablet Take 50 mg by mouth every 8 (eight) hours as needed for pain.  0  . benzonatate (TESSALON) 100 MG capsule Take 2 capsules (200 mg total) by mouth 3 (three) times daily as needed for cough. (Patient not taking: Reported on 01/19/2018) 40 capsule 0  . meloxicam (MOBIC) 7.5 MG tablet Take 1 tablet (7.5 mg total) by mouth daily. (Patient not taking: Reported on 01/19/2018) 30 tablet 2   No facility-administered medications prior to visit.     ROS Review of Systems  Constitutional: Negative for activity change, appetite change and fatigue.  HENT: Negative for congestion, sinus pressure and sore throat.   Eyes: Negative for visual disturbance.  Respiratory: Negative for cough, chest tightness, shortness of breath and wheezing.   Cardiovascular: Negative for chest pain and palpitations.  Gastrointestinal: Negative for abdominal distention, abdominal pain and constipation.  Endocrine: Negative for polydipsia.  Genitourinary: Positive for menstrual problem. Negative for dysuria and frequency.  Musculoskeletal: Negative for arthralgias and back pain.  Skin: Negative for rash.  Neurological: Negative for tremors, light-headedness and numbness.  Hematological: Does not bruise/bleed easily.  Psychiatric/Behavioral: Negative for agitation and behavioral problems.    Objective:  BP 104/69   Pulse 81   Temp (!) 97.5 F (36.4 C) (Oral)   Ht 5\' 4"  (1.626 m)   Wt 195 lb (88.5 kg)   SpO2 98%   BMI 33.47 kg/m   BP/Weight 02/02/2018 01/20/2018 12/28/2017  Systolic BP 104 132 100  Diastolic BP 69 99 68  Wt. (Lbs) 195 196.5 196  BMI 33.47 33.73 33.64      Physical Exam  Constitutional: She is oriented to person, place, and time. She appears well-developed and  well-nourished.  Cardiovascular: Normal rate, normal heart sounds and intact distal pulses.  No murmur heard. Pulmonary/Chest: Effort normal and breath sounds normal. She has no wheezes. She has no rales. She exhibits no tenderness.  Abdominal: Soft. Bowel sounds are normal. She exhibits no distension and no mass. There is no tenderness.  Musculoskeletal: Normal range of motion.  Neurological: She is alert and oriented to person, place, and time.  Skin: Skin is warm and dry.  Psychiatric: She has a normal mood and affect.      Assessment & Plan:   1. Amenorrhea Likely secondary to [place] but will need to exclude structural causes Patient to discuss this with psychiatry - US PELVIC COMPLETE WITH TRANSVAGINAL; Future  2. Other chronic back pain Currently minimal Followed by back specialist and is doing well on diclofenac and Robaxin Continue PT  3. Paranoid schizophrenia (HCC) Currently on in [place] Followed by psychiatry Undergoing counseling  4. High risk medication use - Hemoglobin A1c - Prolactin   No orders of the defined types were placed in this encounter.   Follow-up: Return in about 3 months (around 05/05/2018) for Follow-up of chronic medical conditions.   Hoy RegisterEnobong Skylan Gift MD

## 2018-02-03 LAB — PROLACTIN: Prolactin: 64 ng/mL — ABNORMAL HIGH (ref 4.8–23.3)

## 2018-02-03 LAB — HEMOGLOBIN A1C
ESTIMATED AVERAGE GLUCOSE: 117 mg/dL
HEMOGLOBIN A1C: 5.7 % — AB (ref 4.8–5.6)

## 2018-02-06 ENCOUNTER — Telehealth: Payer: Self-pay | Admitting: Family Medicine

## 2018-02-06 NOTE — Telephone Encounter (Signed)
Melanie called and request for a pre-cert for evercore for the patients upcoming imagining. Please fu at your earliest convenience.

## 2018-02-08 ENCOUNTER — Telehealth: Payer: Self-pay | Admitting: Family Medicine

## 2018-02-08 ENCOUNTER — Ambulatory Visit (HOSPITAL_COMMUNITY): Payer: Medicaid Other

## 2018-02-08 NOTE — Telephone Encounter (Signed)
Preserver needs to be done for Ultrasound evicore.

## 2018-02-08 NOTE — Telephone Encounter (Signed)
Case status for case number [id number] has been approved. Authorization number is [id number].

## 2018-02-09 ENCOUNTER — Ambulatory Visit: Payer: Medicaid Other | Admitting: Physical Therapy

## 2018-02-09 ENCOUNTER — Ambulatory Visit (HOSPITAL_COMMUNITY)
Admission: RE | Admit: 2018-02-09 | Discharge: 2018-02-09 | Disposition: A | Discharge status: Home / Self Care | Payer: Medicaid Other | Source: Ambulatory Visit | Attending: Family Medicine | Admitting: Family Medicine

## 2018-02-09 ENCOUNTER — Encounter: Payer: Self-pay | Admitting: Physical Therapy

## 2018-02-09 DIAGNOSIS — N912 Amenorrhea, unspecified: Secondary | ICD-10-CM | POA: Diagnosis present

## 2018-02-09 DIAGNOSIS — M25551 Pain in right hip: Secondary | ICD-10-CM

## 2018-02-09 DIAGNOSIS — M6281 Muscle weakness (generalized): Secondary | ICD-10-CM

## 2018-02-09 DIAGNOSIS — R293 Abnormal posture: Secondary | ICD-10-CM

## 2018-02-09 NOTE — Therapy (Addendum)
[facility] [address] [place], [place], [address] Phone: [phone]   Fax:  [phone]  Physical Therapy Treatment  Patient Details  Name: Judy Melton MRN: [id number] Date of Birth: [birth year] Referring Provider: Milly Jakob MD   Encounter Date: 02/09/2018  PT End of Session - 02/09/18 1508    Visit Number  3    Number of Visits  12    Date for PT Re-Evaluation  03/02/18    Authorization Type  MCD (Resubmit at 4th visit)    PT Start Time  1506 pt 7 min ate    PT Stop Time  1545    PT Time Calculation (min)  39 min    Activity Tolerance  Patient tolerated treatment well    Behavior During Therapy  [facility] for tasks assessed/performed       Past Medical History:  Diagnosis Date  . Anxiety   . Bipolar 1 disorder (HCC)   . Depression    pt reported  . Paranoid schizophrenia (HCC)   . PTSD (post-traumatic stress disorder)     History reviewed. No pertinent surgical history.  There were no vitals filed for this visit.  Subjective Assessment - 02/09/18 1506    Subjective  "I am feeling good today."    Currently in Pain?  Yes    Pain Score  2     Pain Location  Back    Pain Orientation  Lower    Pain Descriptors / Indicators  Aching    Pain Score  1    Pain Location  Hip    Pain Orientation  Right    Pain Descriptors / Indicators  Tender                       [facility] Adult PT Treatment/Exercise - 02/09/18 0001      Lumbar Exercises: Stretches   ITB Stretch  Right;Left;1 rep;30 seconds      Lumbar Exercises: Supine   Heel Slides  10 reps with pelvic tilt    Bent Knee Raise  10 reps with pelvic tilt      Lumbar Exercises: Sidelying   Hip Abduction  10 reps;Both x 2 sets      Knee/Hip Exercises: Stretches   Passive Hamstring Stretch  1 rep;30 seconds;Both    ITB Stretch  1 rep;30 seconds;Both      Knee/Hip Exercises: Aerobic   Nustep  L5 x 5 min UE and LE      Manual Therapy   Soft tissue mobilization  MTPr of R glute med, muscle rolling of R glute med and ITB             PT Education - 02/09/18 1547    Education Details  HEP, MTPr, muscle rolling    Person(s) Educated  Patient    Methods  Explanation    Comprehension  Verbalized understanding       PT Short Term Goals - 01/19/18 1211      PT SHORT TERM GOAL #1   Title  pt to be I with inital HEp     Baseline  no previous HEp    Time  3    Period  Weeks    Status  New    Target Date  02/09/18      PT SHORT TERM GOAL #2   Title  pt to verbalize/ demo proper posture in sitting/ standing as well as lifting mechanics to prevent/ reduce  hip and low back pain     Baseline  no knowledge of proper posture    Time  3    Period  Weeks    Status  New    Target Date  02/09/18        PT Long Term Goals - 01/19/18 1212      PT LONG TERM GOAL #1   Title  increase r hip strength to >/= 4/5 in all planes to promote hip stability and reduce hip/ low back  pain to </= 2/10 pain     Baseline  r hip flexion 4-/5, extension 3+/5, abduction 4-/5, adduction 3-/5    Time  6    Period  Weeks    Status  New    Target Date  03/02/18      PT LONG TERM GOAL #2   Title  pt to be able to sit/ stand and walk >=/60 min with </= 1/10 pain to assist with work related activities     Baseline  30-40 min reporting pain in hip/ low back to 6/10    Time  6    Period  Weeks    Status  New    Target Date  02/09/18      PT LONG TERM GOAL #3   Title  increase LEFS score to >/= 50/80 to demonstrate improvement in function     Baseline  38/80 initial score    Time  6    Period  Weeks    Status  New    Target Date  03/02/18      PT LONG TERM GOAL #4   Title  pt to be I with all HEP given as of last visit to maintain and progress current level of function    Baseline  no previous HEP    Time  6    Period  Weeks    Status  New    Target Date  03/02/18            Plan - 02/09/18 1508    Clinical Impression  Statement  Treatment session focused on stretching of hamstring and ITB. Also performed MTPr of glute med followed by muscle rolling of ITB and glute med. Also addressed core and hip strengthening. Pt reports no increase in pain at end of session    PT Treatment/Interventions  ADLs/Self Care Home Management;Cryotherapy;Physicist, medical;Therapeutic activities;Iontophoresis 4mg /ml Dexamethasone;Patient/family education;Passive range of motion;Gait training;Manual techniques;Taping;Dry needling;Therapeutic exercise    PT Next Visit Plan  submit to medicaid; review/ update HEP, STW along glute med and ITB, core strength, hip strength, update HEP for ITB stretch    PT Home Exercise Plan  lower trunk rotation, sidelying clamshell, pelvic tilt, adductor ball squeeze    Consulted and Agree with Plan of Care  Patient       Patient will benefit from skilled therapeutic intervention in order to improve the following deficits and impairments:  Decreased activity tolerance, Decreased endurance, Improper body mechanics, Postural dysfunction, Pain, Obesity, Increased fascial restricitons, Decreased strength, Increased muscle spasms  Visit Diagnosis: Pain in right hip  Muscle weakness (generalized)  Abnormal posture     Problem List Patient Active Problem List   Diagnosis Date Noted  . GERD (gastroesophageal reflux disease) 10/13/2016  . History of breast cancer 10/13/2016  . Chronic back pain 10/13/2016  . Paranoid schizophrenia (HCC) 10/13/2016  . Bipolar 1 disorder (HCC) 10/13/2016  . Pain in thoracic spine 10/11/2016   Laurelyn Sickle,  SPT 02/09/18 4:15 PM   [facility] [address] [place], [place], [address] Phone: [phone]   Fax:  [phone]  Name: Justice RocherRasheeda S Rappleye MRN: [id number] Date of Birth: 08/02/1975

## 2018-02-16 ENCOUNTER — Ambulatory Visit: Payer: Medicaid Other | Admitting: Physical Therapy

## 2018-02-16 ENCOUNTER — Encounter: Payer: Self-pay | Admitting: Physical Therapy

## 2018-02-16 DIAGNOSIS — R293 Abnormal posture: Secondary | ICD-10-CM

## 2018-02-16 DIAGNOSIS — M25551 Pain in right hip: Secondary | ICD-10-CM

## 2018-02-16 DIAGNOSIS — M6281 Muscle weakness (generalized): Secondary | ICD-10-CM

## 2018-02-16 NOTE — Therapy (Addendum)
[place], [place], [address] Phone: [phone]   Fax:  [phone]  Physical Therapy Treatment/Re-authorization  Patient Details  Name: Judy Melton MRN: [id number] Date of Birth: 01/08/1976 Referring Provider: Lowella Petties MD   Encounter Date: 02/16/2018  PT End of Session - 02/16/18 1738    Visit Number  4    Number of Visits  12    Date for PT Re-Evaluation  03/02/18    Authorization Type  MCD    PT Start Time  0345    PT Stop Time  0431    PT Time Calculation (min)  46 min    Activity Tolerance  Patient tolerated treatment well    Behavior During Therapy  [facility] for tasks assessed/performed       Past Medical History:  Diagnosis Date  . Anxiety   . Bipolar 1 disorder ([place])   . Depression    pt reported  . Paranoid schizophrenia ([employer])   . PTSD (post-traumatic stress disorder)     History reviewed. No pertinent surgical history.  There were no vitals filed for this visit.  Subjective Assessment - 02/16/18 1545    Subjective  "I am feeling fine today."    Currently in Pain?  Yes    Pain Score  3     Pain Location  Back    Pain Orientation  Lower    Pain Descriptors / Indicators  Aching    Pain Type  Acute pain    Pain Onset  More than a month ago    Pain Frequency  Intermittent    Aggravating Factors   sitting, standing, walking    Pain Relieving Factors  rest, medication, heat         [facility] PT Assessment - 02/16/18 0001      Observation/Other Assessments   Lower Extremity Functional Scale   39/80      AROM   Right Hip Extension  5 painful    Right Hip Flexion  74    Right Hip External Rotation   35    Right Hip Internal Rotation   35    Left Hip Extension  10    Left Hip Flexion  100    Left Hip External Rotation   35    Left Hip Internal Rotation   35    Lumbar Flexion  82 slight discomfort     Lumbar Extension  30    Lumbar - Right Side Bend  22    Lumbar -  Left Side Bend  18 slight discomfort      Strength   Right Hip Flexion  4/5 painful in R hip    Right Hip Extension  3+/5    Right Hip ABduction  4-/5    Right Hip ADduction  3+/5    Left Hip Flexion  4+/5 painful in R hip    Left Hip Extension  3+/5    Left Hip ABduction  4/5    Left Hip ADduction  3+/5                   [facility] Adult PT Treatment/Exercise - 02/16/18 0001      Lumbar Exercises: Stretches   ITB Stretch  Right;2 reps;30 seconds      Manual Therapy   Manual Therapy  Soft tissue mobilization;Taping    Soft tissue mobilization  IASTM R glute med    Kinesiotex  -- R greater trochanteric bursitis  PT Education - 02/16/18 1748    Education Details  POC, IASTM and healing benefits, kinesiotape     Person(s) Educated  Patient    Methods  Explanation    Comprehension  Verbalized understanding       PT Short Term Goals - 02/16/18 1603      PT SHORT TERM GOAL #1   Title  pt to be I with inital HEp     Baseline  pt occasionally performing exercises    Status  Achieved      PT SHORT TERM GOAL #2   Title  pt to verbalize/ demo proper posture in sitting/ standing as well as lifting mechanics to prevent/ reduce hip and low back pain     Baseline  pt able to demo proper posture    Status  Achieved        PT Long Term Goals - 02/16/18 1733      PT LONG TERM GOAL #1   Title  increase r hip strength to >/= 4/5 in all planes to promote hip stability and reduce hip/ low back  pain to </= 2/10 pain     Baseline  R hip flexion 4, ext 3+, abd 4-, add 3+    Status  On-going    Target Date  03/02/18      PT LONG TERM GOAL #2   Title  pt to be able to sit/ stand and walk >=/60 min with </= 1/10 pain to assist with work related activities     Baseline  Pt reports she has not attempted this recently but she does not think she could do this with </= 1/10 yet    Status  On-going    Target Date  03/02/18      PT LONG TERM GOAL #3   Title   increase LEFS score to >/= 50/80 to demonstrate improvement in function     Baseline  39/40 on 7/25    Target Date  03/02/18      PT LONG TERM GOAL #4   Title  pt to be I with all HEP given as of last visit to maintain and progress current level of function    Baseline  pt performing exercises occasionally.    Status  On-going    Target Date  03/02/18            Plan - 02/16/18 1738    Clinical Impression Statement  Reassessment performed today - pt showing increase in lumbar AROM and R hip AROM. Additionally, she demonstrates some increases in R hip strength. She met one of her short term goals and is making progress towards all of her LTGs. Treatment today focused on stretching and manual therapy including IASTM and kinesotaping of R hip. pt reports decreased pain at end of session. Based on reassessment, pt would benefit from continued PT services to address pain, ROM, strength, posture, and functional mobility. Pt is motivated to continue making progress with PT.     Rehab Potential  Good    PT Frequency  2x / week    PT Duration  6 weeks    PT Treatment/Interventions  ADLs/Self Care Home Management;Cryotherapy;Presenter, broadcasting;Therapeutic activities;Iontophoresis 52m/ml Dexamethasone;Patient/family education;Passive range of motion;Gait training;Manual techniques;Taping;Dry needling;Therapeutic exercise    PT Next Visit Plan   ask about taping, review/ update HEP, STW along glute med and ITB, core strength, hip strength, update HEP for ITB stretch; IASTM    PT Home Exercise Plan  lower trunk  rotation, sidelying clamshell, pelvic tilt, adductor ball squeeze    Consulted and Agree with Plan of Care  Patient       Patient will benefit from skilled therapeutic intervention in order to improve the following deficits and impairments:  Decreased activity tolerance, Decreased endurance, Improper body mechanics, Postural dysfunction, Pain,  Obesity, Increased fascial restricitons, Decreased strength, Increased muscle spasms  Visit Diagnosis: Pain in right hip  Muscle weakness (generalized)  Abnormal posture     Problem List Patient Active Problem List   Diagnosis Date Noted  . GERD (gastroesophageal reflux disease) 10/13/2016  . History of breast cancer 10/13/2016  . Chronic back pain 10/13/2016  . Paranoid schizophrenia ([place]) 10/13/2016  . Bipolar 1 disorder ([place]) 10/13/2016  . Pain in thoracic spine 10/11/2016   Worthy Flank, SPT 02/16/18 5:54 PM   [place], [place], [address] Phone: [phone]   Fax:  [phone]  Name: RAYME BUI MRN: [id number] Date of Birth: February 22, 1976

## 2018-02-23 ENCOUNTER — Encounter: Payer: Self-pay | Admitting: Physical Therapy

## 2018-02-23 ENCOUNTER — Ambulatory Visit: Payer: Medicaid Other | Attending: Orthopedic Surgery | Admitting: Physical Therapy

## 2018-02-23 DIAGNOSIS — M25551 Pain in right hip: Secondary | ICD-10-CM | POA: Insufficient documentation

## 2018-02-23 DIAGNOSIS — M6281 Muscle weakness (generalized): Secondary | ICD-10-CM | POA: Insufficient documentation

## 2018-02-23 DIAGNOSIS — R293 Abnormal posture: Secondary | ICD-10-CM | POA: Insufficient documentation

## 2018-02-23 NOTE — Therapy (Addendum)
[facility] [address] [place], [place], [address] Phone: [phone]   Fax:  [phone]  Physical Therapy Treatment  Patient Details  Name: Judy RocherRasheeda S Melton MRN: [id number] Date of Birth: 1976/03/26 Referring Provider: Milly JakobJohn Lee Graves MD   Encounter Date: 02/23/2018  PT End of Session - 02/23/18 0908    Visit Number  5    Number of Visits  12    Date for PT Re-Evaluation  03/02/18    Authorization Type  MCD    PT Start Time  0846    PT Stop Time  0929    PT Time Calculation (min)  43 min    Activity Tolerance  Patient tolerated treatment well    Behavior During Therapy  [facility]/per[facility]       Past Medical History:  Diagnosis Date  . Anxiety   . Bipolar 1 disorder ([facility])   . Depression    pt reported  . Paranoid schizophrenia ([facility])   . PTSD (post-traumatic stress disorder)     History reviewed. No pertinent surgical history.  There were no vitals filed for this visit.  Subjective Assessment - 02/23/18 0849    Subjective  "I feel good today. The tape and treatment helped last time."    Currently in Pain?  No/denies    Pain Score  0-No pain    Pain Location  Back    Pain Orientation  Lower    Pain Type  Acute pain    Pain Onset  More than a month ago    Pain Frequency  Intermittent    Aggravating Factors   activity    Pain Relieving Factors  rest, medication, heat                       [facility] Adult PT Treatment/Exercise - 02/23/18 0001      Lumbar Exercises: Supine   Heel Slides  10 reps with pelvic tilt    Bent Knee Raise  10 reps with pelvic tilt    Dead Bug  -- 3 sets x 15-30 seconds    Bridge with Harley-DavidsonBall Squeeze  10 reps x 2 sets      Lumbar Exercises: Prone   Straight Leg Raise  --      Lumbar Exercises: Quadruped   Straight Leg Raise  -- 2 x 5 ea LE; pt has large weight shift      Knee/Hip Exercises: Aerobic   Elliptical  L2 (no incline) x 5 min      Knee/Hip  Exercises: Machines for Strengthening   Total Gym Leg Press  1 plate 2 x 10    Hip Cybex  2 x 10 bil abduction; 1 x10 bil extension      Knee/Hip Exercises: Standing   Wall Squat  1 set;10 reps challenging for pt; moved to leg press             PT Education - 02/23/18 0934    Education Details  Progression of exercises, HEP update    Person(s) Educated  Patient    Methods  Explanation;Handout    Comprehension  Verbalized understanding       PT Short Term Goals - 02/16/18 1603      PT SHORT TERM GOAL #1   Title  pt to be I with inital HEp     Baseline  pt occasionally performing exercises    Status  Achieved      PT  SHORT TERM GOAL #2   Title  pt to verbalize/ demo proper posture in sitting/ standing as well as lifting mechanics to prevent/ reduce hip and low back pain     Baseline  pt able to demo proper posture    Status  Achieved        PT Long Term Goals - 02/16/18 1733      PT LONG TERM GOAL #1   Title  increase r hip strength to >/= 4/5 in all planes to promote hip stability and reduce hip/ low back  pain to </= 2/10 pain     Baseline  R hip flexion 4, ext 3+, abd 4-, add 3+    Status  On-going    Target Date  03/02/18      PT LONG TERM GOAL #2   Title  pt to be able to sit/ stand and walk >=/60 min with </= 1/10 pain to assist with work related activities     Baseline  Pt reports she has not attempted this recently but she does not think she could do this with </= 1/10 yet    Status  On-going    Target Date  03/02/18      PT LONG TERM GOAL #3   Title  increase LEFS score to >/= 50/80 to demonstrate improvement in function     Baseline  39/40 on 7/25    Target Date  03/02/18      PT LONG TERM GOAL #4   Title  pt to be I with all HEP given as of last visit to maintain and progress current level of function    Baseline  pt performing exercises occasionally.    Status  On-going    Target Date  03/02/18            Plan - 02/23/18 0909     Clinical Impression Statement  Pt reports no pain upon arrival and that she thinks the IASTM and taping helped last treatment session. Progressed some exercises today. Pt attempted wall squats, but reporting some increased pain. She responded to the leg press much better. Additionally, progressed core exercises which proved to be challenging for the patient. Added static dead bug to HEP.    PT Treatment/Interventions  ADLs/Self Care Home Management;Cryotherapy;Physicist, medical;Therapeutic activities;Iontophoresis 4mg /ml Dexamethasone;Patient/family education;Passive range of motion;Gait training;Manual techniques;Taping;Dry needling;Therapeutic exercise    PT Next Visit Plan   ask about taping, review/ update HEP, STW along glute med and ITB, core strength, hip strength, update HEP for ITB stretch; IASTM; machines for strengthening, static dead bug    PT Home Exercise Plan  lower trunk rotation, sidelying clamshell, pelvic tilt, adductor ball squeeze, static dead bug    Consulted and Agree with Plan of Care  Patient       Patient will benefit from skilled therapeutic intervention in order to improve the following deficits and impairments:  Decreased activity tolerance, Decreased endurance, Improper body mechanics, Postural dysfunction, Pain, Obesity, Increased fascial restricitons, Decreased strength, Increased muscle spasms  Visit Diagnosis: Pain in right hip  Muscle weakness (generalized)  Abnormal posture     Problem List Patient Active Problem List   Diagnosis Date Noted  . GERD (gastroesophageal reflux disease) 10/13/2016  . History of breast cancer 10/13/2016  . Chronic back pain 10/13/2016  . Paranoid schizophrenia ([facility]) 10/13/2016  . Bipolar 1 disorder ([facility]) 10/13/2016  . Pain in thoracic spine 10/11/2016   Judy Melton, SPT 02/23/18 9:55 AM   [facility] [address] [place], [place], [address] Phone: [phone]   Fax:  [phone]  Name: Judy Melton MRN: [id number] Date of Birth: 1976-05-13

## 2018-03-09 ENCOUNTER — Ambulatory Visit: Payer: Medicaid Other | Admitting: Physical Therapy

## 2018-03-16 ENCOUNTER — Ambulatory Visit: Payer: Medicaid Other | Admitting: Physical Therapy

## 2018-03-21 ENCOUNTER — Ambulatory Visit: Payer: Medicaid Other | Admitting: Physical Therapy

## 2018-03-23 ENCOUNTER — Encounter: Payer: Self-pay | Admitting: Physical Therapy

## 2018-03-30 ENCOUNTER — Ambulatory Visit: Payer: Medicaid Other | Attending: Orthopedic Surgery | Admitting: Physical Therapy

## 2018-04-06 ENCOUNTER — Ambulatory Visit: Payer: Medicaid Other | Admitting: Physical Therapy

## 2018-04-13 ENCOUNTER — Ambulatory Visit: Payer: Medicaid Other | Admitting: Physical Therapy

## 2018-04-25 ENCOUNTER — Ambulatory Visit: Payer: Medicaid Other | Attending: Orthopedic Surgery | Admitting: Physical Therapy

## 2018-04-25 ENCOUNTER — Encounter: Payer: Self-pay | Admitting: Physical Therapy

## 2018-04-25 DIAGNOSIS — R293 Abnormal posture: Secondary | ICD-10-CM | POA: Diagnosis present

## 2018-04-25 DIAGNOSIS — M6281 Muscle weakness (generalized): Secondary | ICD-10-CM | POA: Insufficient documentation

## 2018-04-25 DIAGNOSIS — M25551 Pain in right hip: Secondary | ICD-10-CM | POA: Diagnosis not present

## 2018-04-25 NOTE — Therapy (Addendum)
[facility] [address] [place], [place], [address] Phone: [phone]   Fax:  [phone]  Physical Therapy Treatment / Re-certification  Patient Details  Name: Judy Melton MRN: [id number] Date of Birth: 1976-01-04 Referring Provider (PT): Milly Jakob MD   Encounter Date: 04/25/2018  PT End of Session - 04/25/18 1514    Visit Number  6    Number of Visits  12    Date for PT Re-Evaluation  06/06/18    Authorization Type  MCD    PT Start Time  1506    PT Stop Time  1549    PT Time Calculation (min)  43 min    Activity Tolerance  Patient tolerated treatment well    Behavior During Therapy  [facility] for tasks assessed/performed       Past Medical History:  Diagnosis Date  . Anxiety   . Bipolar 1 disorder ([facility])   . Depression    pt reported  . Paranoid schizophrenia ([facility])   . PTSD (post-traumatic stress disorder)     History reviewed. No pertinent surgical history.  There were no vitals filed for this visit.  Subjective Assessment - 04/25/18 1515    Subjective  "the hip is doing much better, my low back is bothering me and my R knee and L foot are giving issues. I haven't returned to see the MD because the insurance"    Patient Stated Goals  to improve standing tolerance, decrease pain in the hip,     Currently in Pain?  Yes    Pain Score  6     Pain Location  Back    Pain Orientation  Mid;Lower    Pain Descriptors / Indicators  --   pinching   Pain Type  Chronic pain    Pain Onset  More than a month ago    Pain Frequency  Intermittent    Aggravating Factors   unsure    Pain Relieving Factors  rest,     Multiple Pain Sites  Yes    Pain Score  0    Pain Location  Hip    Pain Orientation  Right    Aggravating Factors   laying still for a long time, standing up for long time    Pain Relieving Factors  resting    Pain Score  6    Pain Location  Knee    Pain Orientation  Right    Pain Descriptors /  Indicators  Aching    Pain Type  Chronic pain    Pain Onset  More than a month ago    Pain Frequency  Intermittent    Aggravating Factors   standing/ walking for long distances    Pain Relieving Factors  sit and prop the leg up    Pain Score  6    Pain Location  Ankle    Pain Orientation  Left    Pain Descriptors / Indicators  Aching;Sore    Pain Type  Chronic pain    Pain Onset  More than a month ago    Pain Frequency  Intermittent    Aggravating Factors   standing and walking,     Pain Relieving Factors  sitting/ resting and propping the leg up         [facility] PT Assessment - 04/25/18 1522      Assessment   Medical Diagnosis  R Trochanteric Bursitis    Referring Provider (PT)  Milly Jakob  MD    Hand Dominance  Right      Observation/Other Assessments   Lower Extremity Functional Scale   44/80      AROM   Right Hip Extension  5    Right Hip Flexion  90    Right Hip External Rotation   28    Right Hip Internal Rotation   31      Strength   Right Hip Flexion  5/5    Right Hip Extension  4-/5    Right Hip ABduction  4/5    Right Hip ADduction  4-/5                           PT Education - 04/25/18 1547    Education Details  reviewed previously provided HEP and posture education. discussed POC and re-assessment finding    Person(s) Educated  Patient    Methods  Explanation;Verbal cues    Comprehension  Verbalized understanding;Verbal cues required       PT Short Term Goals - 02/16/18 1603      PT SHORT TERM GOAL #1   Title  pt to be I with inital HEp     Baseline  pt occasionally performing exercises    Status  Achieved      PT SHORT TERM GOAL #2   Title  pt to verbalize/ demo proper posture in sitting/ standing as well as lifting mechanics to prevent/ reduce hip and low back pain     Baseline  pt able to demo proper posture    Status  Achieved        PT Long Term Goals - 04/25/18 1553      PT LONG TERM GOAL #1   Title  increase  r hip strength to >/= 4/5 in all planes to promote hip stability and reduce hip/ low back  pain to </= 2/10 pain     Baseline  ext 4-/5 ,add 4-/5 with back pain at 6/10    Time  5    Period  Weeks    Status  On-going    Target Date  05/30/18      PT LONG TERM GOAL #2   Title  pt to be able to sit/ stand and walk >=/60 min with </= 1/10 pain to assist with work related activities     Baseline  pt able to walk and stand for 60 min but reports pain is 4/10     Time  5    Period  Weeks    Status  On-going    Target Date  05/30/18      PT LONG TERM GOAL #3   Title  increase LEFS score to >/= 50/80 to demonstrate improvement in function     Baseline  44/80    Time  5    Period  Weeks    Status  On-going    Target Date  05/30/18      PT LONG TERM GOAL #4   Title  pt to be I with all HEP given as of last visit to maintain and progress current level of function    Baseline  independent with current exercises and progressin as able.     Time  5    Period  Weeks    Status  On-going    Target Date  05/30/18            Plan - 04/25/18 1550  Clinical Impression Statement  Judy Melton reports improvement of pain the R hip reporting it as intermittent but states she has R knee, low back and L ankle pain which is likely due to posture and pt's limited endurance. she demonstrated improvement in hip ROM and hip strength. she is making good progress toward long term goals and is motivated to continue with physical therapy and improve with strength and endurance and reduce low back / hip pain. she only attended 2 of the 12 approved visits due scheduling conflicts and a lapse in her insurance coverage. she would benefit from continued physical therapy to increase hip strength, improve posture, increase endurance and maximixze her function and work toward discharge.     Rehab Potential  Good    PT Frequency  2x / week    PT Duration  4 weeks    PT Treatment/Interventions  ADLs/Self Care Home  Management;Cryotherapy;Physicist, medical;Therapeutic activities;Iontophoresis 4mg /ml Dexamethasone;Patient/family education;Passive range of motion;Gait training;Manual techniques;Taping;Dry needling;Therapeutic exercise    PT Next Visit Plan  endurance training, posture and lifting mechanics, functional strengthening    PT Home Exercise Plan  lower trunk rotation, sidelying clamshell, pelvic tilt, adductor ball squeeze, static dead bug    Consulted and Agree with Plan of Care  Patient       Patient will benefit from skilled therapeutic intervention in order to improve the following deficits and impairments:  Decreased activity tolerance, Decreased endurance, Improper body mechanics, Postural dysfunction, Pain, Obesity, Increased fascial restricitons, Decreased strength, Increased muscle spasms  Visit Diagnosis: Pain in right hip - Plan: PT plan of care cert/re-cert  Muscle weakness (generalized) - Plan: PT plan of care cert/re-cert  Abnormal posture - Plan: PT plan of care cert/re-cert     Problem List Patient Active Problem List   Diagnosis Date Noted  . GERD (gastroesophageal reflux disease) 10/13/2016  . History of breast cancer 10/13/2016  . Chronic back pain 10/13/2016  . Paranoid schizophrenia ([facility]) 10/13/2016  . Bipolar 1 disorder ([facility]) 10/13/2016  . Pain in thoracic spine 10/11/2016   Lulu Riding PT, DPT, LAT, ATC  04/25/18  4:14 PM      [facility] [address] [place], [place], [address] Phone: [phone]   Fax:  [phone]  Name: Judy Melton MRN: [id number] Date of Birth: 10/18/1975

## 2018-04-25 NOTE — Patient Instructions (Signed)

## 2018-05-09 ENCOUNTER — Ambulatory Visit: Payer: Medicaid Other | Admitting: Physical Therapy

## 2018-05-11 ENCOUNTER — Encounter: Payer: Self-pay | Admitting: Physical Therapy

## 2018-05-11 ENCOUNTER — Ambulatory Visit: Payer: Medicaid Other | Admitting: Physical Therapy

## 2018-05-11 ENCOUNTER — Ambulatory Visit: Payer: Self-pay | Admitting: Family Medicine

## 2018-05-11 DIAGNOSIS — R293 Abnormal posture: Secondary | ICD-10-CM

## 2018-05-11 DIAGNOSIS — M25551 Pain in right hip: Secondary | ICD-10-CM | POA: Diagnosis not present

## 2018-05-11 DIAGNOSIS — M6281 Muscle weakness (generalized): Secondary | ICD-10-CM

## 2018-05-11 NOTE — Therapy (Addendum)
[place], [place], [address] Phone: [phone]   Fax:  [phone]  Physical Therapy Treatment / Discharge Summary  Patient Details  Name: Judy Melton MRN: [id number] Date of Birth: [birth year] Referring Provider (PT): Lowella Petties MD   Encounter Date: 05/11/2018  PT End of Session - 05/11/18 0954    Visit Number  7    Number of Visits  12    Date for PT Re-Evaluation  06/06/18    Authorization Type  MCD    Authorization Time Period  05/07/2018-06/03/2018    Authorization - Visit Number  1    Authorization - Number of Visits  8    PT Start Time  [phone]   pt arrived 12 min late   PT Stop Time  1012    PT Time Calculation (min)  30 min    Activity Tolerance  Patient tolerated treatment well    Behavior During Therapy  [facility] for tasks assessed/performed       Past Medical History:  Diagnosis Date  . Anxiety   . Bipolar 1 disorder ([place])   . Depression    pt reported  . Paranoid schizophrenia ([place])   . PTSD (post-traumatic stress disorder)     History reviewed. No pertinent surgical history.  There were no vitals filed for this visit.  Subjective Assessment - 05/11/18 0945    Subjective  "I overslept this morning because I wasn't sleeping well last night. The pain has been bearing. I have been practicing the posture things we discussed last time and it definitley helps. my foot has still been bothering me."    Currently in Pain?  Yes    Pain Score  2     Pain Location  Back    Pain Orientation  Lower    Pain Descriptors / Indicators  Sore    Pain Type  Chronic pain    Pain Onset  More than a month ago    Pain Frequency  Intermittent    Aggravating Factors   unsure    Pain Relieving Factors  resting    Pain Score  0    Pain Location  Hip    Pain Orientation  Right    Pain Onset  More than a month ago    Pain Frequency  Intermittent    Aggravating Factors   laying still for long  periods of time    Pain Score  0    Pain Onset  More than a month ago    Pain Frequency  Intermittent    Aggravating Factors   standing/ walking for long periods of time    Pain Relieving Factors  sitting    Pain Score  0    Pain Type  Chronic pain    Aggravating Factors   with prolonged standing/ walking    Pain Relieving Factors  sitting/ resting                        [facility] Adult PT Treatment/Exercise - 05/11/18 0001      Therapeutic Activites    Therapeutic Activities  Other Therapeutic Activities    Other Therapeutic Activities  log rolling from suping <> sit, 2 x 5, demonstration for proper with intermittent verbal cues.       Exercises   Exercises  Knee/Hip;Ankle      Lumbar Exercises: Supine   Dead Bug  5 reps   with 10  sec hold, verbal cues to count out loud for breathin     Knee/Hip Exercises: Stretches   Passive Hamstring Stretch  Both;2 reps;30 seconds      Knee/Hip Exercises: Aerobic   Nustep  L6 x 6 min LE only      Knee/Hip Exercises: Standing   Heel Raises  20 reps   with bil HHA for balance     Knee/Hip Exercises: Seated   Sit to Sand  15 reps;without UE support;2 sets      Ankle Exercises: Stretches   Slant Board Stretch  4 reps;30 seconds   gastroc x 2, soleus x 2            PT Education - 05/11/18 1013    Education Details  proper log rolling technique with getting into and out of bed.     Person(s) Educated  Patient    Methods  Explanation;Demonstration;Handout    Comprehension  Verbalized understanding;Returned demonstration;Verbal cues required       PT Short Term Goals - 02/16/18 1603      PT SHORT TERM GOAL #1   Title  pt to be I with inital HEp     Baseline  pt occasionally performing exercises    Status  Achieved      PT SHORT TERM GOAL #2   Title  pt to verbalize/ demo proper posture in sitting/ standing as well as lifting mechanics to prevent/ reduce hip and low back pain     Baseline  pt able to demo  proper posture    Status  Achieved        PT Long Term Goals - 04/25/18 1553      PT LONG TERM GOAL #1   Title  increase r hip strength to >/= 4/5 in all planes to promote hip stability and reduce hip/ low back  pain to </= 2/10 pain     Baseline  ext 4-/5 ,add 4-/5 with back pain at 6/10    Time  5    Period  Weeks    Status  On-going    Target Date  05/30/18      PT LONG TERM GOAL #2   Title  pt to be able to sit/ stand and walk >=/60 min with </= 1/10 pain to assist with work related activities     Baseline  pt able to walk and stand for 60 min but reports pain is 4/10     Time  5    Period  Weeks    Status  On-going    Target Date  05/30/18      PT LONG TERM GOAL #3   Title  increase LEFS score to >/= 50/80 to demonstrate improvement in function     Baseline  44/80    Time  5    Period  Weeks    Status  On-going    Target Date  05/30/18      PT LONG TERM GOAL #4   Title  pt to be I with all HEP given as of last visit to maintain and progress current level of function    Baseline  independent with current exercises and progressin as able.     Time  5    Period  Weeks    Status  On-going    Target Date  05/30/18            Plan - 05/11/18 0954    Clinical Impression Statement  pt arrived 67  min late today. continued working on strengthening of bil LE with empahsis on endurance. practiced bed mobility focusing on log rolling to prevent torquing the low back which she performed well with demonstration and verbal cues. end of session no increase in pain noted.     Rehab Potential  Good    PT Treatment/Interventions  ADLs/Self Care Home Management;Cryotherapy;Presenter, broadcasting;Therapeutic activities;Iontophoresis 16m/ml Dexamethasone;Patient/family education;Passive range of motion;Gait training;Manual techniques;Taping;Dry needling;Therapeutic exercise    PT Next Visit Plan  endurance training, posture and lifting  mechanics, functional strengthening,     PT Home Exercise Plan  lower trunk rotation, sidelying clamshell, pelvic tilt, adductor ball squeeze, static dead bug, log rolling    Consulted and Agree with Plan of Care  Patient       Patient will benefit from skilled therapeutic intervention in order to improve the following deficits and impairments:  Decreased activity tolerance, Decreased endurance, Improper body mechanics, Postural dysfunction, Pain, Obesity, Increased fascial restricitons, Decreased strength, Increased muscle spasms  Visit Diagnosis: Pain in right hip  Muscle weakness (generalized)  Abnormal posture     Problem List Patient Active Problem List   Diagnosis Date Noted  . GERD (gastroesophageal reflux disease) 10/13/2016  . History of breast cancer 10/13/2016  . Chronic back pain 10/13/2016  . [place]oid [place] 10/13/2016  . Bipolar 1 disorder ([place] 10/13/2016  . Pain in thoracic spine 10/11/2016   KStarr LakePT, DPT, LAT, ATC  05/11/18  10:16 AM      CPulciferCO[facility] LLC[address] [address]Phone: [phone]  Fax:  [phone] Name: RBREIONNA PUNTMRN: [id number] of Birth: [birth year]     PHYSICAL THERAPY DISCHARGE SUMMARY  Visits from Start of Care: 7  Current functional level related to goals / functional outcomes: See goals   Remaining deficits: unknown   Education / Equipment: HEP, theraband, posture  Plan: Patient agrees to discharge.  Patient goals were partially met. Patient is being discharged due to not returning since the last visit.  ?????          PT, DPT, LAT, ATC  06/08/18  2:02 PM

## 2018-05-16 ENCOUNTER — Ambulatory Visit: Payer: Medicaid Other | Admitting: Physical Therapy

## 2018-05-16 ENCOUNTER — Telehealth: Payer: Self-pay | Admitting: Physical Therapy

## 2018-05-16 NOTE — Telephone Encounter (Signed)
Called patient about missed visit.  She had to work and she forgot to call.  She was reminded of  date and time of next visit.  I asked her to call and let us know if she was unable to attend due to out policy.  She has our phone number and agreed to call if she does not attend.  She was sorry for missed visit.  Liz Beach PTA

## 2018-05-18 ENCOUNTER — Ambulatory Visit: Payer: Medicaid Other | Admitting: Physical Therapy

## 2018-05-23 ENCOUNTER — Ambulatory Visit: Payer: Medicaid Other | Admitting: Physical Therapy

## 2018-05-25 ENCOUNTER — Ambulatory Visit: Payer: Medicaid Other | Admitting: Physical Therapy

## 2018-05-30 ENCOUNTER — Ambulatory Visit: Payer: Medicaid Other | Admitting: Physical Therapy

## 2018-06-01 ENCOUNTER — Encounter: Payer: Self-pay | Admitting: Physical Therapy

## 2018-06-09 ENCOUNTER — Encounter

## 2018-06-19 ENCOUNTER — Ambulatory Visit: Payer: Self-pay | Admitting: Family Medicine

## 2018-07-06 ENCOUNTER — Ambulatory Visit: Payer: Medicaid Other | Attending: Family Medicine | Admitting: Family Medicine

## 2018-07-06 ENCOUNTER — Encounter: Payer: Self-pay | Admitting: Family Medicine

## 2018-07-06 VITALS — BP 116/76 | HR 86 | Temp 97.5°F | Ht 64.0 in | Wt 200.4 lb

## 2018-07-06 DIAGNOSIS — F4310 Post-traumatic stress disorder, unspecified: Secondary | ICD-10-CM | POA: Insufficient documentation

## 2018-07-06 DIAGNOSIS — F319 Bipolar disorder, unspecified: Secondary | ICD-10-CM | POA: Insufficient documentation

## 2018-07-06 DIAGNOSIS — Z131 Encounter for screening for diabetes mellitus: Secondary | ICD-10-CM | POA: Diagnosis not present

## 2018-07-06 DIAGNOSIS — G8929 Other chronic pain: Secondary | ICD-10-CM | POA: Insufficient documentation

## 2018-07-06 DIAGNOSIS — F200 Paranoid schizophrenia: Secondary | ICD-10-CM | POA: Insufficient documentation

## 2018-07-06 DIAGNOSIS — M549 Dorsalgia, unspecified: Secondary | ICD-10-CM

## 2018-07-06 DIAGNOSIS — M545 Low back pain: Secondary | ICD-10-CM | POA: Insufficient documentation

## 2018-07-06 DIAGNOSIS — Z79899 Other long term (current) drug therapy: Secondary | ICD-10-CM | POA: Insufficient documentation

## 2018-07-06 DIAGNOSIS — E221 Hyperprolactinemia: Secondary | ICD-10-CM

## 2018-07-06 LAB — POCT GLYCOSYLATED HEMOGLOBIN (HGB A1C): HbA1c, POC (prediabetic range): 5.4 % — AB (ref 5.7–6.4)

## 2018-07-06 NOTE — Progress Notes (Signed)
Subjective:  Patient ID: Judy Melton, female    DOB: April 22, 1976  Age: 42 y.o. MRN: [id number]  CC: Back Pain   HPI Judy Melton  is a 42 year old female with a history of schizophrenia, bipolar disorder, chronic low back pain who comes into the clinic today for a follow-up visit. She has been undergoing PT and is requesting a work note to allow her sit as she works at [employer] and is on her feet for prolonged periods which worsens her back pain and causes pedal edema. Pain is a 4/10 at this time but sometimes radiates down her lower extremities bilaterally but she denies numbness in her  Extremities. She had hyperprolactinemia from her last set of labs which was thought to be possibly related to College use and her Psychiatrist has discontinued Invega and she would like to have  repeat labs. Denies galactorrhea.  Past Medical History:  Diagnosis Date  . Anxiety   . Bipolar 1 disorder ([place])   . Depression    pt reported  . Paranoid schizophrenia ([place])   . PTSD (post-traumatic stress disorder)     History reviewed. No pertinent surgical history.  No Known Allergies   Outpatient Medications Prior to Visit  Medication Sig Dispense Refill  . INVEGA SUSTENNA 234 MG/1.5ML SUSY injection Take 234 mg by mouth every 30 (thirty) days.  2  . methocarbamol (ROBAXIN-750) 750 MG tablet Take 1 tablet (750 mg total) by mouth 4 (four) times daily. (Patient taking differently: Take 750 mg by mouth 2 (two) times daily. ) 60 tablet 2  . Multiple Vitamin (MULTIVITAMIN WITH MINERALS) TABS tablet Take 1 tablet by mouth daily.    . polyethylene glycol powder (GLYCOLAX/MIRALAX) powder Take 17 g by mouth 2 (two) times daily as needed. 3350 g 1  . traMADol (ULTRAM) 50 MG tablet Take 50 mg by mouth every 8 (eight) hours as needed for pain.  0  . benzonatate (TESSALON) 100 MG capsule Take 2 capsules (200 mg total) by mouth 3 (three) times daily as needed for cough. (Patient not taking:  Reported on 01/19/2018) 40 capsule 0  . diclofenac (VOLTAREN) 75 MG EC tablet Take 75 mg by mouth 2 (two) times daily.  0  . hydrOXYzine (ATARAX/VISTARIL) 25 MG tablet Take 1 tablet (25 mg total) by mouth every 6 (six) hours as needed for anxiety. (Patient not taking: Reported on 07/06/2018) 30 tablet 0  . lidocaine (LIDODERM) 5 % Place 1 patch onto the skin daily. Remove & Discard patch within 12 hours or as directed by MD (Patient not taking: Reported on 07/06/2018) 30 patch 0   No facility-administered medications prior to visit.     ROS Review of Systems  Constitutional: Negative for activity change, appetite change and fatigue.  HENT: Negative for congestion, sinus pressure and sore throat.   Eyes: Negative for visual disturbance.  Respiratory: Negative for cough, chest tightness, shortness of breath and wheezing.   Cardiovascular: Negative for chest pain and palpitations.  Gastrointestinal: Negative for abdominal distention, abdominal pain and constipation.  Endocrine: Negative for polydipsia.  Genitourinary: Negative for dysuria and frequency.  Musculoskeletal: Positive for back pain. Negative for arthralgias.  Skin: Negative for rash.  Neurological: Negative for tremors, light-headedness and numbness.  Hematological: Does not bruise/bleed easily.  Psychiatric/Behavioral: Negative for agitation and behavioral problems.    Objective:  BP 116/76   Pulse 86   Temp (!) 97.5 F (36.4 C) (Oral)   Ht '5\' 4"'  (1.626 m)  Wt 200 lb 6.4 oz (90.9 kg)   SpO2 98%   BMI 34.40 kg/m   BP/Weight 07/06/2018 02/02/2018 8/83/2549  Systolic BP 826 415 830  Diastolic BP 76 69 99  Wt. (Lbs) 200.4 195 196.5  BMI 34.4 33.47 33.73      Physical Exam Constitutional:      Appearance: She is well-developed.  Cardiovascular:     Rate and Rhythm: Normal rate.     Heart sounds: Normal heart sounds. No murmur.  Pulmonary:     Effort: Pulmonary effort is normal.     Breath sounds: Normal breath  sounds. No wheezing or rales.  Chest:     Chest wall: No tenderness.  Abdominal:     General: Bowel sounds are normal. There is no distension.     Palpations: Abdomen is soft. There is no mass.     Tenderness: There is no abdominal tenderness.  Musculoskeletal: Normal range of motion.     Comments: No tenderness to palpation of entire lumbar spine Negative straight leg raise bilaterally  Neurological:     Mental Status: She is alert and oriented to person, place, and time.     Lab Results  Component Value Date   HGBA1C 5.4 (A) 07/06/2018    Assessment & Plan:   1. Other chronic back pain Apply heat Provided work note as requested - Ambulatory referral to Physical Therapy  2. Hyperprolactinemia (HCC) Previous elevation thought to be related to [facility] which has been discontinued. Will repeat level - CMP14+EGFR - Prolactin - FSH/LH  3. Screening for diabetes mellitus A1c of 5.4   No orders of the defined types were placed in this encounter.   Follow-up: Return in about 6 months (around 01/05/2019) for follow up of chronic medical conditions.   Charlott Rakes MD

## 2018-07-07 LAB — CMP14+EGFR
A/G RATIO: 1.2 (ref 1.2–2.2)
ALBUMIN: 4.2 g/dL (ref 3.5–5.5)
ALT: 24 IU/L (ref 0–32)
AST: 25 IU/L (ref 0–40)
Alkaline Phosphatase: 115 IU/L (ref 39–117)
BILIRUBIN TOTAL: 0.2 mg/dL (ref 0.0–1.2)
BUN / CREAT RATIO: 11 (ref 9–23)
BUN: 10 mg/dL (ref 6–24)
CALCIUM: 9.2 mg/dL (ref 8.7–10.2)
CHLORIDE: 104 mmol/L (ref 96–106)
CO2: 17 mmol/L — ABNORMAL LOW (ref 20–29)
Creatinine, Ser: 0.94 mg/dL (ref 0.57–1.00)
GFR, EST AFRICAN AMERICAN: 87 mL/min/{1.73_m2} (ref >59)
GFR, EST NON AFRICAN AMERICAN: 75 mL/min/{1.73_m2} (ref >59)
GLOBULIN, TOTAL: 3.6 g/dL (ref 1.5–4.5)
Glucose: 102 mg/dL — ABNORMAL HIGH (ref 65–99)
POTASSIUM: 4 mmol/L (ref 3.5–5.2)
Sodium: 139 mmol/L (ref 134–144)
TOTAL PROTEIN: 7.8 g/dL (ref 6.0–8.5)

## 2018-07-07 LAB — PROLACTIN: Prolactin: 55.2 ng/mL — ABNORMAL HIGH (ref 4.8–23.3)

## 2018-07-07 LAB — FSH/LH
FSH: 6.3 m[IU]/mL
LH: 7.6 m[IU]/mL

## 2018-07-14 ENCOUNTER — Other Ambulatory Visit: Payer: Self-pay | Admitting: Family Medicine

## 2018-08-03 ENCOUNTER — Ambulatory Visit: Payer: Medicaid Other | Admitting: Physical Therapy

## 2018-08-17 ENCOUNTER — Ambulatory Visit: Payer: Medicaid Other | Admitting: Physical Therapy

## 2019-09-06 IMAGING — DX DG THORACOLUMBAR SPINE 2V
2 series · 2 of 2 positions shown · non-contrast
Comparison: 08/16/2016

CLINICAL DATA: Low back pain

EXAM:
THORACOLUMBAR SPINE 1V

[tl-spine ap]
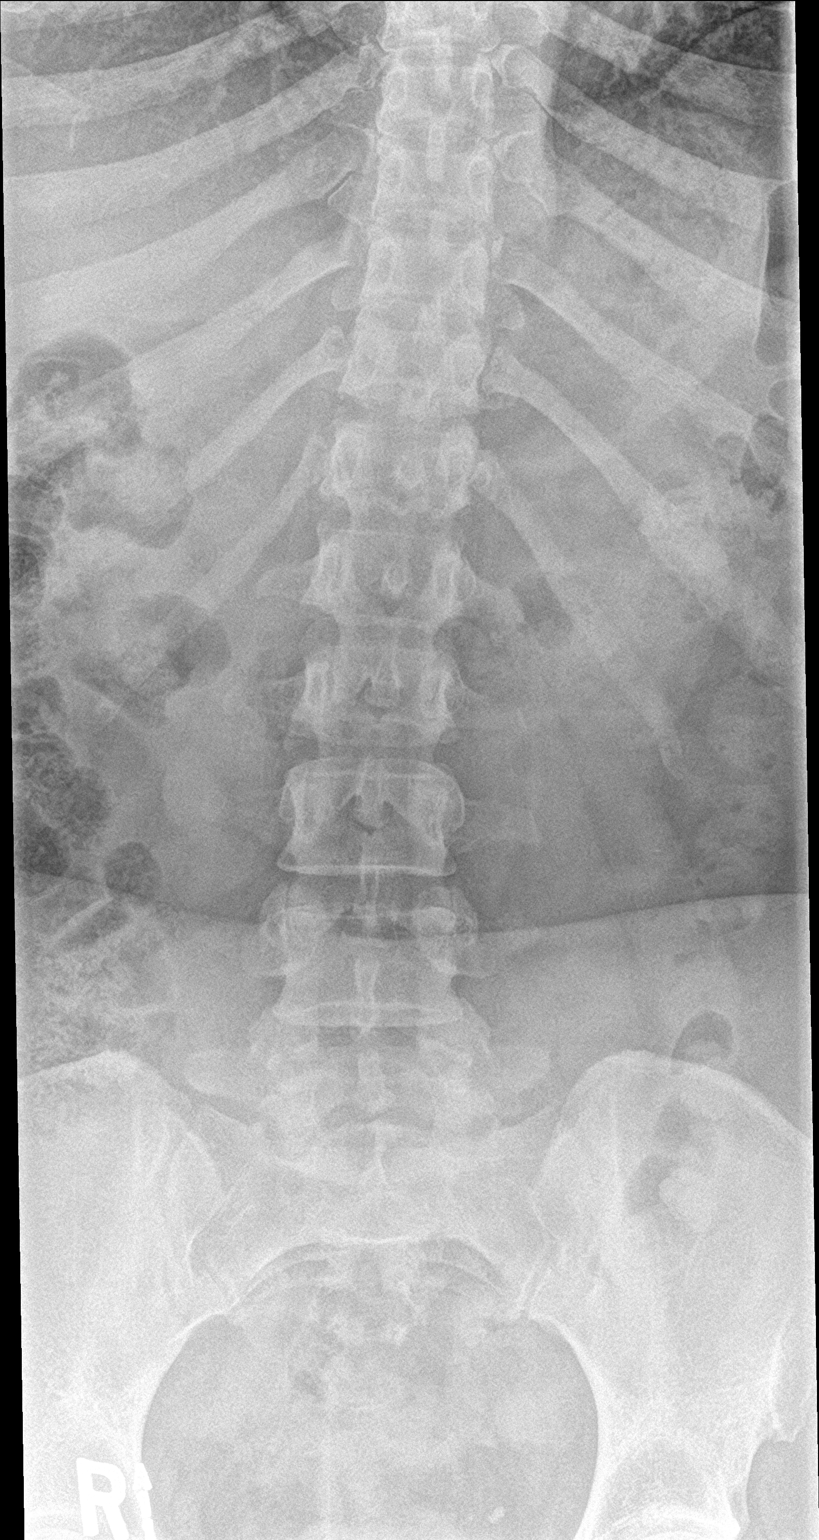

[tl-spine lat]
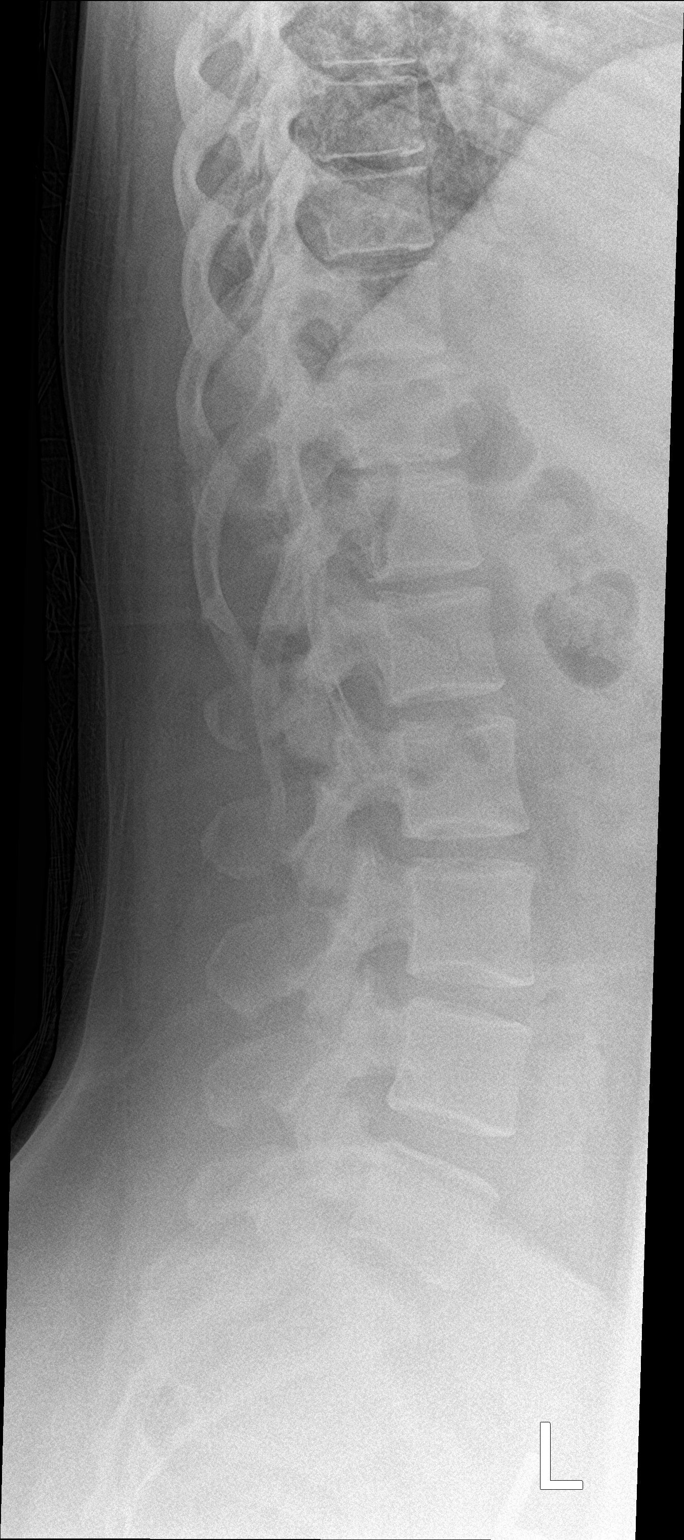

[2 of 2 positions shown; findings below may reference images not displayed]

FINDINGS: Minimal rightward curvature in the mid to lower lumbar spine
measuring approximately 9 degrees. No acute or congenital bony
anomaly. No secondary curvature. No subluxation. Disc spaces are
maintained.
IMPRESSION: Slight rightward scoliosis in the mid to lower lumbar spine. No
acute bony abnormality.
